# Patient Record
Sex: Male | Born: 1980 | Race: Black or African American | Hispanic: No | Marital: Married | Smoking: Never smoker
Health system: Southern US, Community
[De-identification: ages and names within clinical notes are randomized; demographics above are authoritative.]

## PROBLEM LIST (undated history)

## (undated) DIAGNOSIS — K4090 Unilateral inguinal hernia, without obstruction or gangrene, not specified as recurrent: Secondary | ICD-10-CM

---

## 2001-05-30 ENCOUNTER — Encounter: Payer: Self-pay | Admitting: Emergency Medicine

## 2001-05-30 ENCOUNTER — Emergency Department (HOSPITAL_COMMUNITY): Admission: EM | Admit: 2001-05-30 | Discharge: 2001-05-30 | Payer: Self-pay | Admitting: Emergency Medicine

## 2003-07-30 ENCOUNTER — Emergency Department (HOSPITAL_COMMUNITY): Admission: EM | Admit: 2003-07-30 | Discharge: 2003-07-30 | Payer: Self-pay | Admitting: *Deleted

## 2004-12-19 ENCOUNTER — Ambulatory Visit: Payer: Self-pay | Admitting: Internal Medicine

## 2011-10-20 ENCOUNTER — Emergency Department (HOSPITAL_COMMUNITY)
Admission: EM | Admit: 2011-10-20 | Discharge: 2011-10-21 | Disposition: A | Discharge status: Home / Self Care | Payer: Self-pay | Attending: Emergency Medicine | Admitting: Emergency Medicine

## 2011-10-20 ENCOUNTER — Emergency Department (HOSPITAL_COMMUNITY): Payer: Self-pay

## 2011-10-20 ENCOUNTER — Encounter (HOSPITAL_COMMUNITY): Payer: Self-pay | Admitting: *Deleted

## 2011-10-20 DIAGNOSIS — M7989 Other specified soft tissue disorders: Secondary | ICD-10-CM | POA: Insufficient documentation

## 2011-10-20 DIAGNOSIS — I10 Essential (primary) hypertension: Secondary | ICD-10-CM | POA: Insufficient documentation

## 2011-10-20 DIAGNOSIS — M79609 Pain in unspecified limb: Secondary | ICD-10-CM | POA: Insufficient documentation

## 2011-10-20 DIAGNOSIS — W208XXA Other cause of strike by thrown, projected or falling object, initial encounter: Secondary | ICD-10-CM | POA: Insufficient documentation

## 2011-10-20 DIAGNOSIS — M79673 Pain in unspecified foot: Secondary | ICD-10-CM

## 2011-10-20 NOTE — ED Notes (Signed)
Pt dropped tv on L foot. Unable to bear weight.

## 2011-10-20 NOTE — ED Notes (Signed)
Pt in stating he was moving a TV and it fell on his left foot, increased pain with walking

## 2011-10-21 MED ORDER — OXYCODONE-ACETAMINOPHEN 5-325 MG PO TABS: 1.0000 | ORAL_TABLET | Freq: Four times a day (QID) | ORAL | Status: AC | PRN

## 2011-10-21 MED ORDER — IBUPROFEN 800 MG PO TABS: 800.0000 mg | ORAL_TABLET | Freq: Three times a day (TID) | ORAL | Status: AC | PRN

## 2011-10-21 MED ORDER — OXYCODONE-ACETAMINOPHEN 5-325 MG PO TABS
1.0000 | ORAL_TABLET | Freq: Once | ORAL | Status: AC
  Administered 2011-10-21: 1 via ORAL
  Filled 2011-10-21: qty 1

## 2011-10-21 NOTE — Discharge Instructions (Signed)
Your x-rays during her hospital visit are negative for acute fracture, however due to your story and physical exam I am treating you as though you had a fracture.  Followup with orthopedics in 3-5 days.  I have listed a doctor in your discharge paperwork for you to call.  A repeat x-ray in 2-3 weeks is recommended.  Reconstructions below to learn more about the type of fracture or you may have.  Use your crutches to avoid pain and you may weight bear as tolerated.  Use pain medication as prescribed and to not operate any heavy machinery while on this pain medication.  Read instructions below on ice therapy.  Use Motrin as prescribed and take with a full meal to prevent upset stomach   Metatarsal Stress Fracture  When too much stress is put on the foot, as in running and jumping sports, the center shaft of the bones of the forefoot is very susceptible to stress fractures (break in bone). This is because of repetitive stress on the bone. This injury is more common if osteoporosis is present or if inadequate running shoes are used. Rapid increase in running distances are often the cause. Running distances should be gradually increased to avoid this problem. Shoes should be used which adequately cushion the foot. Shoes should absorb the shocks of the activity.  DIAGNOSIS  Usually the diagnosis is made by history. The foot progressively becomes sorer with activities. X-rays may be negative (show no break) within the first 2 to 3 weeks of the beginning of pain. A later X-ray may show signs of healing bone (callus formation). A bone scan or MRI will usually make the diagnosis earlier.  TREATMENT AND HOME CARE INSTRUCTIONS  Treatment may or may not include a cast, removable fracture boot, or walking shoe. Casts are used for short periods of time to prevent muscle atrophy (muscle wasting).  Activities should be stopped until further advised by your caregiver.  Wear shoes with adequate shock absorbing abilities.    Alternative exercise may be undertaken while waiting for healing. These may include bicycling and swimming, or as your caregiver suggests.  If you do not have a cast or splint:  You may walk on your injured foot as tolerated or advised.  Do not put any weight on your injured foot for as long as directed by your caregiver. Slowly increase the amount of time you walk on the foot as the pain allows or as advised.  Use crutches until you can bear weight without pain. A gradual increase in weight bearing may help.  Apply ice to the injury for 15 to 20 minutes each hour while awake for the first 2 days. Put the ice in a plastic bag and place a towel between the bag of ice and your skin.  Only take over-the-counter or prescription medicines for pain, discomfort, or fever as directed by your caregiver.  SEEK IMMEDIATE MEDICAL CARE IF:  Pain is becoming worse rather than better, or if pain is uncontrolled with medications.  You have increased swelling or redness in the foot.   Cryotherapy  Cryotherapy means treatment with cold. Ice or gel packs can be used to reduce both pain and swelling. Ice is the most helpful within the first 24 to 48 hours after an injury or flareup from overusing a muscle or joint. Sprains, strains, spasms, burning pain, shooting pain, and aches can all be eased with ice. Ice can also be used when recovering from surgery. Ice is effective, has very  few side effects, and is safe for most people to use.  PRECAUTIONS  Ice is not a safe treatment option for people with:  Raynaud's phenomenon. This is a condition affecting small blood vessels in the extremities. Exposure to cold may cause your problems to return.  Cold hypersensitivity. There are many forms of cold hypersensitivity, including:  Cold urticaria. Red, itchy hives appear on the skin when the tissues begin to warm after being iced.  Cold erythema. This is a red, itchy rash caused by exposure to cold.  Cold hemoglobinuria.  Red blood cells break down when the tissues begin to warm after being iced. The hemoglobin that carry oxygen are passed into the urine because they cannot combine with blood proteins fast enough.  Numbness or altered sensitivity in the area being iced.  If you have any of the following conditions, do not use ice until you have discussed cryotherapy with your caregiver:  Heart conditions, such as arrhythmia, angina, or chronic heart disease.  High blood pressure.  Healing wounds or open skin in the area being iced.  Current infections.  Rheumatoid arthritis.  Poor circulation.  Diabetes.  Ice slows the blood flow in the region it is applied. This is beneficial when trying to stop inflamed tissues from spreading irritating chemicals to surrounding tissues. However, if you expose your skin to cold temperatures for too long or without the proper protection, you can damage your skin or nerves. Watch for signs of skin damage due to cold.  HOME CARE INSTRUCTIONS  Follow these tips to use ice and cold packs safely.  Place a dry or damp towel between the ice and skin. A damp towel will cool the skin more quickly, so you may need to shorten the time that the ice is used.  For a more rapid response, add gentle compression to the ice.  Ice for no more than 10 to 20 minutes at a time. The bonier the area you are icing, the less time it will take to get the benefits of ice.  Check your skin after 5 minutes to make sure there are no signs of a poor response to cold or skin damage.  Rest 20 minutes or more in between uses.  Once your skin is numb, you can end your treatment. You can test numbness by very lightly touching your skin. The touch should be so light that you do not see the skin dimple from the pressure of your fingertip. When using ice, most people will feel these normal sensations in this order: cold, burning, aching, and numbness.  Do not use ice on someone who cannot communicate their responses to  pain, such as small children or people with dementia.  HOW TO MAKE AN ICE PACK  Ice packs are the most common way to use ice therapy. Other methods include ice massage, ice baths, and cryo-sprays. Muscle creams that cause a cold, tingly feeling do not offer the same benefits that ice offers and should not be used as a substitute unless recommended by your caregiver.  To make an ice pack, do one of the following:  Place crushed ice or a bag of frozen vegetables in a sealable plastic bag. Squeeze out the excess air. Place this bag inside another plastic bag. Slide the bag into a pillowcase or place a damp towel between your skin and the bag.  Mix 3 parts water with 1 part rubbing alcohol. Freeze the mixture in a sealable plastic bag. When you remove the mixture  from the freezer, it will be slushy. Squeeze out the excess air. Place this bag inside another plastic bag. Slide the bag into a pillowcase or place a damp towel between your skin and the bag.  SEEK MEDICAL CARE IF:  You develop white spots on your skin. This may give the skin a blotchy (mottled) appearance.  Your skin turns blue or pale.  Your skin becomes waxy or hard.  Your swelling gets worse.

## 2011-10-21 NOTE — ED Provider Notes (Signed)
History     CSN: [id number]  Arrival date & time 10/20/11  2202   First MD Initiated Contact with Patient 10/21/11 0010      Chief Complaint  Patient presents with  . Foot Injury    Dropped tv on L foot    (Consider location/radiation/quality/duration/timing/severity/associated sxs/prior treatment) HPI Comments: Patient presents emergency Department with a chief complaint of left foot pain.  Patient states he was moving a very large heavy old school box television when it fell on his left foot from about height is 4 feet that was dropped with impact being left foot. Pain severity is 10/10. Pt is unable to ambulate or bear weight d/t pain. Pt denies numbness and tingling of extremity, states he still has feeling, and denies any ankle pain. Pt says he is able to wiggle his toes, but that this causes extreme pain  The history is provided by the patient.    History reviewed. No pertinent past medical history.  Past Surgical History  Procedure Date  . Dental surgery     History reviewed. No pertinent family history.  History  Substance Use Topics  . Smoking status: Never Smoker   . Smokeless tobacco: Not on file  . Alcohol Use: No      Review of Systems  Constitutional: Negative for fever, chills and appetite change.  HENT: Negative for congestion.   Eyes: Negative for visual disturbance.  Respiratory: Negative for shortness of breath.   Cardiovascular: Negative for chest pain and leg swelling.  Gastrointestinal: Negative for abdominal pain.  Genitourinary: Negative for dysuria, urgency and frequency.  Musculoskeletal: Positive for gait problem.  Neurological: Negative for dizziness, syncope, weakness, light-headedness, numbness and headaches.  Psychiatric/Behavioral: Negative for confusion.    Allergies  Review of patient's allergies indicates no known allergies.  Home Medications   Current Outpatient Rx  Name Route Sig Dispense Refill  . BISMUTH SUBSALICYLATE  262 MG PO CHEW Oral Chew 524 mg by mouth as needed.      BP 140/92  Pulse 69  Temp(Src) 98.8 F (37.1 C) (Oral)  Resp 18  Ht 6\' 1"  (1.854 m)  Wt 180 lb (81.647 kg)  BMI 23.75 kg/m2  SpO2 100%  Physical Exam  Nursing note and vitals reviewed. Constitutional: He is oriented to person, place, and time. He appears well-developed and well-nourished. No distress.  HENT:  Head: Normocephalic and atraumatic.  Eyes: Conjunctivae and EOM are normal.  Neck: Normal range of motion.  Pulmonary/Chest: Effort normal.  Musculoskeletal: Normal range of motion.       Full active and passive range of motion of right ankle.  No tenderness to palpation over distal fibula or tibia no tenderness over medial or lateral malleolus.  Extreme pain with passive eversion and inversion of foot.  Tenderness to palpation over her second and third metatarsal.  Contusion and swelling over this area as well.  Distal pulses and sensation intact.  Normal capillary refill.  Skin intact  Neurological: He is alert and oriented to person, place, and time.  Skin: Skin is warm and dry. No rash noted. He is not diaphoretic.  Psychiatric: He has a normal mood and affect. His behavior is normal.    ED Course  Procedures (including critical care time)  Labs Reviewed - No data to display Dg Foot Complete Left  10/20/2011  *RADIOLOGY REPORT*  Clinical Data: TV fell on left foot.  Pain across metatarsal bones.  LEFT FOOT - COMPLETE 3+ VIEW  Comparison: None.  Findings: Three views of the left foot were obtained.  Negative for fracture or dislocation.  Normal alignment of the left foot.  IMPRESSION: Negative for acute fracture or dislocation.  Original Report Authenticated By: Richarda Overlie, M.D.     No diagnosis found.    MDM  Foot pain  Patient history and presentation is consistent with possible stress fracture of the second and third metatarsal.  Initial x-rays are negative.  Patient has been advised for repeat x-rays in  2-3 weeks.  Patient treated in the emergency department with a Cam Walker, given crutches, advised for rest and followup with orthopedics in 3-5 days.       Jaci Carrel, [place] 10/21/11 [phone]

## 2011-10-21 NOTE — ED Provider Notes (Signed)
Medical screening examination/treatment/procedure(s) were performed by non-physician practitioner and as supervising physician I was immediately available for consultation/collaboration.  Nicholes Stairs, MD 10/21/11 1205

## 2012-11-17 ENCOUNTER — Ambulatory Visit
Admission: RE | Admit: 2012-11-17 | Discharge: 2012-11-17 | Disposition: A | Discharge status: Home / Self Care | Payer: Self-pay | Source: Ambulatory Visit | Attending: Radiation Oncology | Admitting: Radiation Oncology

## 2012-11-17 ENCOUNTER — Encounter: Payer: Self-pay | Admitting: Radiation Oncology

## 2012-11-17 NOTE — Progress Notes (Deleted)
Patient here weekly rad tx leeft breast, dermatitis in between breasts, uses cortisone cream, and radiaplex gel, slight erythema rest of breast , skin intact, no c/o pain, eating well,no fatigue 8:52 AM

## 2013-03-03 DIAGNOSIS — K4090 Unilateral inguinal hernia, without obstruction or gangrene, not specified as recurrent: Secondary | ICD-10-CM

## 2013-03-03 HISTORY — DX: Unilateral inguinal hernia, without obstruction or gangrene, not specified as recurrent: K40.90

## 2013-03-09 ENCOUNTER — Ambulatory Visit (INDEPENDENT_AMBULATORY_CARE_PROVIDER_SITE_OTHER): Payer: Medicaid Other | Admitting: Surgery

## 2013-03-09 ENCOUNTER — Encounter (INDEPENDENT_AMBULATORY_CARE_PROVIDER_SITE_OTHER): Payer: Self-pay | Admitting: Surgery

## 2013-03-09 VITALS — BP 110/68 | HR 66 | Temp 97.5°F | Resp 15 | Ht 73.0 in | Wt 181.8 lb

## 2013-03-09 DIAGNOSIS — K4090 Unilateral inguinal hernia, without obstruction or gangrene, not specified as recurrent: Secondary | ICD-10-CM | POA: Insufficient documentation

## 2013-03-09 NOTE — Patient Instructions (Signed)
Inguinal Hernia, Adult  Care After Refer to this sheet in the next few weeks. These discharge instructions provide you with general information on caring for yourself after you leave the hospital. Your caregiver may also give you specific instructions. Your treatment has been planned according to the most current medical practices available, but unavoidable complications sometimes occur. If you have any problems or questions after discharge, please call your caregiver. HOME CARE INSTRUCTIONS  Put ice on the operative site.  Put ice in a plastic bag.  Place a towel between your skin and the bag.  Leave the ice on for 15-20 minutes at a time, 3-4 times a day while awake.  Change bandages (dressings) as directed.  Keep the wound dry and clean. The wound may be washed gently with soap and water. Gently blot or dab the wound dry. It is okay to take showers 24 to 48 hours after surgery. Do not take baths, use swimming pools, or use hot tubs for 10 days, or as directed by your caregiver.  Only take over-the-counter or prescription medicines for pain, discomfort, or fever as directed by your caregiver.  Continue your normal diet as directed.  Do not lift anything more than 10 pounds or play contact sports for 3 weeks, or as directed. SEEK MEDICAL CARE IF:  There is redness, swelling, or increasing pain in the wound.  There is fluid (pus) coming from the wound.  There is drainage from a wound lasting longer than 1 day.  You have an oral temperature above 102 F (38.9 C).  You notice a bad smell coming from the wound or dressing.  The wound breaks open after the stitches (sutures) have been removed.  You notice increasing pain in the shoulders (shoulder strap areas).  You develop dizzy episodes or fainting while standing.  You feel sick to your stomach (nauseous) or throw up (vomit). SEEK IMMEDIATE MEDICAL CARE IF:  You develop a rash.  You have difficulty breathing.  You  develop a reaction or have side effects to medicines you were given. MAKE SURE YOU:   Understand these instructions.  Will watch your condition.  Will get help right away if you are not doing well or get worse. Document Released: 09/20/2006 Document Revised: 11/12/2011 Document Reviewed: 07/20/2009 Exit[employer] Patient Information 2014 [employer].  

## 2013-03-09 NOTE — Progress Notes (Signed)
Patient ID: Ricky Keith, male   DOB: Jan 16, 1981, 32 y.o.   MRN: [id number]  Chief Complaint  Patient presents with  . New Evaluation    eval [facility]    HPI Ricky Keith is a 32 y.o. male.  Patient sent the request of Dr. Mayford Knife for bulge  in his left groin. HPIthis has been present for one year. Made worse with exertion. The bulge is larger. Is causing mild to moderate discomfort with exertion.  History reviewed. No pertinent past medical history.  Past Surgical History  Procedure Laterality Date  . Dental surgery      Family History  Problem Relation Age of Onset  . Hypertension Mother     Social History History  Substance Use Topics  . Smoking status: Never Smoker   . Smokeless tobacco: Not on file  . Alcohol Use: No    No Known Allergies  No current outpatient prescriptions on file.   No current facility-administered medications for this visit.    Review of Systems Review of Systems  Constitutional: Negative for fever, chills and unexpected weight change.  HENT: Negative for hearing loss, congestion, sore throat, trouble swallowing and voice change.   Eyes: Negative for visual disturbance.  Respiratory: Negative for cough and wheezing.   Cardiovascular: Negative for chest pain, palpitations and leg swelling.  Gastrointestinal: Negative for nausea, vomiting, abdominal pain, diarrhea, constipation, blood in stool, abdominal distention, anal bleeding and rectal pain.  Genitourinary: Negative for hematuria and difficulty urinating.  Musculoskeletal: Negative for arthralgias.  Skin: Negative for rash and wound.  Neurological: Negative for seizures, syncope, weakness and headaches.  Hematological: Negative for adenopathy. Does not bruise/bleed easily.  Psychiatric/Behavioral: Negative for confusion.    Blood pressure 110/68, pulse 66, temperature 97.5 F (36.4 C), temperature source Temporal, resp. rate 15, height 6\' 1"  (1.854 m), weight 181 lb 12.8 oz  (82.464 kg).  Physical Exam Physical Exam  Constitutional: He is oriented to person, place, and time. He appears well-developed. He appears distressed.  HENT:  Head: Normocephalic and atraumatic.  Eyes: Pupils are equal, round, and reactive to light.  Neck: Normal range of motion.  Cardiovascular: Normal rate and regular rhythm.   Pulmonary/Chest: Effort normal and breath sounds normal.  Abdominal: Soft. Bowel sounds are normal. There is no tenderness. A hernia is present. Hernia confirmed positive in the left inguinal area. Hernia confirmed negative in the right inguinal area.  Musculoskeletal: Normal range of motion.  Neurological: He is alert and oriented to person, place, and time.  Skin: Skin is warm and dry.  Psychiatric: He has a normal mood and affect. His behavior is normal. Judgment and thought content normal.    Data Reviewed none  Assessment    Left inguinal hernia reducible    Plan    Discussed options of observation versus repair. Recommend left inguinal hernia with mesh.The risk of hernia repair include bleeding,  Infection,   Recurrence of the hernia,  Mesh use, chronic pain,  Organ injury,  Bowel injury,  Bladder injury,   nerve injury with numbness around the incision,  Death,  and worsening of preexisting  medical problems.  The alternatives to surgery have been discussed as well.Discussed possible infertility. Long term expectations of both operative and non operative treatments have been discussed.   The patient agrees to proceed.         Monasia Lair A. 03/09/2013, 12:08 PM

## 2013-03-12 ENCOUNTER — Encounter (HOSPITAL_BASED_OUTPATIENT_CLINIC_OR_DEPARTMENT_OTHER): Payer: Self-pay | Admitting: *Deleted

## 2013-03-17 ENCOUNTER — Encounter (HOSPITAL_BASED_OUTPATIENT_CLINIC_OR_DEPARTMENT_OTHER): Payer: Self-pay | Admitting: *Deleted

## 2013-03-17 ENCOUNTER — Ambulatory Visit (HOSPITAL_BASED_OUTPATIENT_CLINIC_OR_DEPARTMENT_OTHER): Payer: Medicaid Other | Admitting: Anesthesiology

## 2013-03-17 ENCOUNTER — Ambulatory Visit (HOSPITAL_BASED_OUTPATIENT_CLINIC_OR_DEPARTMENT_OTHER)
Admission: RE | Admit: 2013-03-17 | Discharge: 2013-03-17 | Disposition: A | Discharge status: Home / Self Care | Payer: Medicaid Other | Source: Ambulatory Visit | Attending: Surgery | Admitting: Surgery

## 2013-03-17 ENCOUNTER — Encounter (HOSPITAL_BASED_OUTPATIENT_CLINIC_OR_DEPARTMENT_OTHER): Payer: Self-pay | Admitting: Anesthesiology

## 2013-03-17 ENCOUNTER — Encounter (HOSPITAL_BASED_OUTPATIENT_CLINIC_OR_DEPARTMENT_OTHER)
Admission: RE | Disposition: A | Discharge status: Home / Self Care | Payer: Self-pay | Source: Ambulatory Visit | Attending: Surgery

## 2013-03-17 DIAGNOSIS — K4090 Unilateral inguinal hernia, without obstruction or gangrene, not specified as recurrent: Secondary | ICD-10-CM

## 2013-03-17 HISTORY — PX: INSERTION OF MESH: SHX5868

## 2013-03-17 HISTORY — PX: INGUINAL HERNIA REPAIR: SHX194

## 2013-03-17 HISTORY — DX: Unilateral inguinal hernia, without obstruction or gangrene, not specified as recurrent: K40.90

## 2013-03-17 SURGERY — REPAIR, HERNIA, INGUINAL, ADULT
Anesthesia: General | Site: Groin | Laterality: Left | Wound class: Clean

## 2013-03-17 MED ORDER — CHLORHEXIDINE GLUCONATE 4 % EX LIQD: 1.0000 "application " | Freq: Once | CUTANEOUS | Status: DC

## 2013-03-17 MED ORDER — GLYCOPYRROLATE 0.2 MG/ML IJ SOLN
INTRAMUSCULAR | Status: DC | PRN
  Administered 2013-03-17: 0.4 mg via INTRAVENOUS

## 2013-03-17 MED ORDER — ONDANSETRON HCL 4 MG/2ML IJ SOLN
INTRAMUSCULAR | Status: DC | PRN
  Administered 2013-03-17: 4 mg via INTRAVENOUS

## 2013-03-17 MED ORDER — DEXTROSE 5 % IV SOLN
3.0000 g | INTRAVENOUS | Status: AC
  Administered 2013-03-17: 3 g via INTRAVENOUS

## 2013-03-17 MED ORDER — LIDOCAINE HCL (CARDIAC) 20 MG/ML IV SOLN
INTRAVENOUS | Status: DC | PRN
  Administered 2013-03-17: 60 mg via INTRAVENOUS

## 2013-03-17 MED ORDER — LIDOCAINE HCL 4 % MT SOLN
OROMUCOSAL | Status: DC | PRN
  Administered 2013-03-17: 2 mL via TOPICAL

## 2013-03-17 MED ORDER — LACTATED RINGERS IV SOLN
INTRAVENOUS | Status: DC
  Administered 2013-03-17 (×2): via INTRAVENOUS
  Administered 2013-03-17: 20 mL/h via INTRAVENOUS

## 2013-03-17 MED ORDER — HYDROMORPHONE HCL PF 1 MG/ML IJ SOLN
0.2500 mg | INTRAMUSCULAR | Status: DC | PRN
  Administered 2013-03-17 (×2): 0.5 mg via INTRAVENOUS

## 2013-03-17 MED ORDER — DEXAMETHASONE SODIUM PHOSPHATE 4 MG/ML IJ SOLN
INTRAMUSCULAR | Status: DC | PRN
  Administered 2013-03-17: 10 mg via INTRAVENOUS

## 2013-03-17 MED ORDER — OXYCODONE HCL 5 MG/5ML PO SOLN: 5.0000 mg | Freq: Once | ORAL | Status: DC | PRN

## 2013-03-17 MED ORDER — ROCURONIUM BROMIDE 100 MG/10ML IV SOLN
INTRAVENOUS | Status: DC | PRN
  Administered 2013-03-17: 40 mg via INTRAVENOUS

## 2013-03-17 MED ORDER — FENTANYL CITRATE 0.05 MG/ML IJ SOLN
50.0000 ug | INTRAMUSCULAR | Status: DC | PRN
  Administered 2013-03-17: 100 ug via INTRAVENOUS

## 2013-03-17 MED ORDER — FENTANYL CITRATE 0.05 MG/ML IJ SOLN
INTRAMUSCULAR | Status: DC | PRN
  Administered 2013-03-17: 100 ug via INTRAVENOUS

## 2013-03-17 MED ORDER — MIDAZOLAM HCL 2 MG/2ML IJ SOLN
1.0000 mg | INTRAMUSCULAR | Status: DC | PRN
  Administered 2013-03-17: 2 mg via INTRAVENOUS

## 2013-03-17 MED ORDER — TRAMADOL HCL 50 MG PO TABS: 50.0000 mg | ORAL_TABLET | Freq: Four times a day (QID) | ORAL | Status: DC | PRN

## 2013-03-17 MED ORDER — BUPIVACAINE-EPINEPHRINE 0.25% -1:200000 IJ SOLN
INTRAMUSCULAR | Status: DC | PRN
Start: 2013-03-17 — End: 2013-03-17
  Administered 2013-03-17: 8 mL

## 2013-03-17 MED ORDER — PROPOFOL 10 MG/ML IV BOLUS
INTRAVENOUS | Status: DC | PRN
  Administered 2013-03-17: 200 mg via INTRAVENOUS

## 2013-03-17 MED ORDER — MIDAZOLAM HCL 2 MG/ML PO SYRP: 12.0000 mg | ORAL_SOLUTION | Freq: Once | ORAL | Status: DC | PRN

## 2013-03-17 MED ORDER — OXYCODONE HCL 5 MG PO TABS: 5.0000 mg | ORAL_TABLET | Freq: Once | ORAL | Status: DC | PRN

## 2013-03-17 MED ORDER — PROMETHAZINE HCL 25 MG/ML IJ SOLN: 6.2500 mg | INTRAMUSCULAR | Status: DC | PRN

## 2013-03-17 MED ORDER — NEOSTIGMINE METHYLSULFATE 1 MG/ML IJ SOLN
INTRAMUSCULAR | Status: DC | PRN
  Administered 2013-03-17: 3 mg via INTRAVENOUS

## 2013-03-17 MED ORDER — BUPIVACAINE-EPINEPHRINE PF 0.5-1:200000 % IJ SOLN
INTRAMUSCULAR | Status: DC | PRN
  Administered 2013-03-17: 150 mg

## 2013-03-17 MED ORDER — OXYCODONE-ACETAMINOPHEN 5-325 MG PO TABS: 2.0000 | ORAL_TABLET | ORAL | Status: DC | PRN

## 2013-03-17 SURGICAL SUPPLY — 49 items
BLADE SURG 15 STRL LF DISP TIS (BLADE) ×1 IMPLANT
BLADE SURG 15 STRL SS (BLADE) ×1
BLADE SURG ROTATE 9660 (MISCELLANEOUS) ×2 IMPLANT
CANISTER SUCTION 1200CC (MISCELLANEOUS) ×2 IMPLANT
CHLORAPREP W/TINT 26ML (MISCELLANEOUS) ×2 IMPLANT
CLOTH BEACON ORANGE TIMEOUT ST (SAFETY) ×2 IMPLANT
COVER MAYO STAND STRL (DRAPES) ×2 IMPLANT
COVER TABLE BACK 60X90 (DRAPES) ×2 IMPLANT
DECANTER SPIKE VIAL GLASS SM (MISCELLANEOUS) IMPLANT
DERMABOND ADVANCED (GAUZE/BANDAGES/DRESSINGS) ×1
DERMABOND ADVANCED .7 DNX12 (GAUZE/BANDAGES/DRESSINGS) ×1 IMPLANT
DRAIN PENROSE 1/2X12 LTX STRL (WOUND CARE) ×2 IMPLANT
DRAPE LAPAROTOMY TRNSV 102X78 (DRAPE) ×2 IMPLANT
DRAPE UTILITY XL STRL (DRAPES) ×2 IMPLANT
ELECT COATED BLADE 2.86 ST (ELECTRODE) ×2 IMPLANT
ELECT REM PT RETURN 9FT ADLT (ELECTROSURGICAL) ×2
ELECTRODE REM PT RTRN 9FT ADLT (ELECTROSURGICAL) ×1 IMPLANT
GAUZE SPONGE 4X4 12PLY STRL LF (GAUZE/BANDAGES/DRESSINGS) IMPLANT
GAUZE SPONGE 4X4 16PLY XRAY LF (GAUZE/BANDAGES/DRESSINGS) IMPLANT
GLOVE BIO SURGEON STRL SZ8 (GLOVE) ×2 IMPLANT
GLOVE BIOGEL PI IND STRL 8 (GLOVE) ×2 IMPLANT
GLOVE BIOGEL PI INDICATOR 8 (GLOVE) ×2
GLOVE ECLIPSE 8.0 STRL XLNG CF (GLOVE) ×2 IMPLANT
GOWN PREVENTION PLUS XLARGE (GOWN DISPOSABLE) ×2 IMPLANT
GOWN PREVENTION PLUS XXLARGE (GOWN DISPOSABLE) ×2 IMPLANT
MESH HERNIA SYS ULTRAPRO LRG (Mesh General) ×2 IMPLANT
NEEDLE HYPO 25X1 1.5 SAFETY (NEEDLE) ×2 IMPLANT
NS IRRIG 1000ML POUR BTL (IV SOLUTION) ×2 IMPLANT
PACK BASIN DAY SURGERY FS (CUSTOM PROCEDURE TRAY) ×2 IMPLANT
PENCIL BUTTON HOLSTER BLD 10FT (ELECTRODE) ×2 IMPLANT
SLEEVE SCD COMPRESS KNEE MED (MISCELLANEOUS) ×2 IMPLANT
SPONGE LAP 4X18 X RAY DECT (DISPOSABLE) ×2 IMPLANT
STAPLER VISISTAT 35W (STAPLE) IMPLANT
SUT MON AB 4-0 PC3 18 (SUTURE) ×2 IMPLANT
SUT NOVA 0 T19/GS 22DT (SUTURE) ×4 IMPLANT
SUT VIC AB 0 SH 27 (SUTURE) ×2 IMPLANT
SUT VIC AB 2-0 SH 27 (SUTURE) ×1
SUT VIC AB 2-0 SH 27XBRD (SUTURE) ×1 IMPLANT
SUT VIC AB 3-0 54X BRD REEL (SUTURE) IMPLANT
SUT VIC AB 3-0 BRD 54 (SUTURE)
SUT VICRYL 3-0 CR8 SH (SUTURE) ×2 IMPLANT
SUT VICRYL AB 2 0 TIE (SUTURE) ×1 IMPLANT
SUT VICRYL AB 2 0 TIES (SUTURE) ×1
SYR CONTROL 10ML LL (SYRINGE) ×2 IMPLANT
TAPE HYPAFIX 4 X10 (GAUZE/BANDAGES/DRESSINGS) IMPLANT
TOWEL OR 17X24 6PK STRL BLUE (TOWEL DISPOSABLE) ×2 IMPLANT
TOWEL OR NON WOVEN STRL DISP B (DISPOSABLE) ×2 IMPLANT
TUBE CONNECTING 20X1/4 (TUBING) ×2 IMPLANT
YANKAUER SUCT BULB TIP NO VENT (SUCTIONS) ×2 IMPLANT

## 2013-03-17 NOTE — Progress Notes (Signed)
Assisted Dr. Singer with left, ultrasound guided, transabdominal plane block. Side rails up, monitors on throughout procedure. See vital signs in flow sheet. Tolerated Procedure well.  

## 2013-03-17 NOTE — Interval H&P Note (Signed)
History and Physical Interval Note:  03/17/2013 1:06 PM  Ricky Keith  has presented today for surgery, with the diagnosis of inguinal hernia  The various methods of treatment have been discussed with the patient and family. After consideration of risks, benefits and other options for treatment, the patient has consented to  Procedure(s): LEFT INGUINAL HERNIA REPAIR (Left) INSERTION OF MESH (Left) as a surgical intervention .  The patient's history has been reviewed, patient examined, no change in status, stable for surgery.  I have reviewed the patient's chart and labs.  Questions were answered to the patient's satisfaction.     Ricky Zelaya A.

## 2013-03-17 NOTE — Discharge Instructions (Signed)
[facility] _______[facility]  UMBILICAL OR INGUINAL HERNIA REPAIR: POST OP INSTRUCTIONS  Always review your discharge instruction sheet given to you by the facility where your surgery was performed. IF YOU HAVE DISABILITY OR FAMILY LEAVE FORMS, YOU MUST BRING THEM TO THE OFFICE FOR PROCESSING.   DO NOT GIVE THEM TO YOUR DOCTOR.  1. A  prescription for pain medication may be given to you upon discharge.  Take your pain medication as prescribed, if needed.  If narcotic pain medicine is not needed, then you may take acetaminophen (Tylenol) or ibuprofen (Advil) as needed. 2. Take your usually prescribed medications unless otherwise directed. 3. If you need a refill on your pain medication, please contact your pharmacy.  They will contact our office to request authorization. Prescriptions will not be filled after 5 pm or on week-ends. 4. You should follow a light diet the first 24 hours after arrival home, such as soup and crackers, etc.  Be sure to include lots of fluids daily.  Resume your normal diet the day after surgery. 5. Most patients will experience some swelling and bruising around the umbilicus or in the groin and scrotum.  Ice packs and reclining will help.  Swelling and bruising can take several days to resolve.  6. It is common to experience some constipation if taking pain medication after surgery.  Increasing fluid intake and taking a stool softener (such as Colace) will usually help or prevent this problem from occurring.  A mild laxative (Milk of Magnesia or Miralax) should be taken according to package directions if there are no bowel movements after 48 hours. 7. Unless discharge instructions indicate otherwise, you may remove your bandages 24-48 hours after surgery, and you may shower at that time.  You may have steri-strips (small skin tapes) in place directly over the incision.  These strips should be left on the skin for 7-10 days.  If your surgeon used skin glue on the  incision, you may shower in 24 hours.  The glue will flake off over the next 2-3 weeks.  Any sutures or staples will be removed at the office during your follow-up visit. 8. ACTIVITIES:  You may resume regular (light) daily activities beginning the next day--such as daily self-care, walking, climbing stairs--gradually increasing activities as tolerated.  You may have sexual intercourse when it is comfortable.  Refrain from any heavy lifting or straining until approved by your doctor. a. You may drive when you are no longer taking prescription pain medication, you can comfortably wear a seatbelt, and you can safely maneuver your car and apply brakes. b. RETURN TO WORK:  ____3 - 4 weeks______________________________________________________ 9. You should see your doctor in the office for a follow-up appointment approximately 2-3 weeks after your surgery.  Make sure that you call for this appointment within a day or two after you arrive home to insure a convenient appointment time. 10. OTHER INSTRUCTIONS:  __________________________________________________________________________________________________________________________________________________________________________________________  WHEN TO CALL YOUR DOCTOR: 1. Fever over 101.0 2. Inability to urinate 3. Nausea and/or vomiting 4. Extreme swelling or bruising 5. Continued bleeding from incision. 6. Increased pain, redness, or drainage from the incision  The clinic staff is available to answer your questions during regular business hours.  Please dont hesitate to call and ask to speak to one of the nurses for clinical concerns.  If you have a medical emergency, go to the nearest emergency room or call 911.  A surgeon from [facility] is always on call at the  hospital   [address], [address], [place], [place]  [address] ?  [address] [address], [place], [place]   [address] [phone] ? [phone] ? FAX [phone] Web site:  [email or web address]   Post Anesthesia Home Care Instructions  Activity: Get plenty of rest for the remainder of the day. A responsible adult should stay with you for 24 hours following the procedure.  For the next 24 hours, DO NOT: -Drive a car -Advertising copywriter -Drink alcoholic beverages -Take any medication unless instructed by your physician -Make any legal decisions or sign important papers.  Meals: Start with liquid foods such as gelatin or soup. Progress to regular foods as tolerated. Avoid greasy, spicy, heavy foods. If nausea and/or vomiting occur, drink only clear liquids until the nausea and/or vomiting subsides. Call your physician if vomiting continues.  Special Instructions/Symptoms: Your throat may feel dry or sore from the anesthesia or the breathing tube placed in your throat during surgery. If this causes discomfort, gargle with warm salt water. The discomfort should disappear within 24 hours.

## 2013-03-17 NOTE — Anesthesia Preprocedure Evaluation (Signed)
Anesthesia Evaluation  Patient identified by MRN, date of birth, ID band Patient awake    Reviewed: Allergy & Precautions, H&P , NPO status , Patient's Chart, lab work & pertinent test results  Airway Mallampati: I TM Distance: >3 FB Neck ROM: full    Dental  (+) Teeth Intact and Dental Advidsory Given   Pulmonary neg pulmonary ROS,  breath sounds clear to auscultation        Cardiovascular negative cardio ROS  Rhythm:regular Rate:Normal     Neuro/Psych negative neurological ROS  negative psych ROS   GI/Hepatic negative GI ROS, Neg liver ROS,   Endo/Other  negative endocrine ROS  Renal/GU negative Renal ROS     Musculoskeletal   Abdominal   Peds  Hematology   Anesthesia Other Findings   Reproductive/Obstetrics negative OB ROS                           Anesthesia Physical Anesthesia Plan  ASA: I  Anesthesia Plan: General LMA and General ETT   Post-op Pain Management: MAC Combined w/ Regional for Post-op pain   Induction:   Airway Management Planned:   Additional Equipment:   Intra-op Plan:   Post-operative Plan:   Informed Consent: I have reviewed the patients History and Physical, chart, labs and discussed the procedure including the risks, benefits and alternatives for the proposed anesthesia with the patient or authorized representative who has indicated his/her understanding and acceptance.   Dental Advisory Given  Plan Discussed with: Anesthesiologist, CRNA and Surgeon  Anesthesia Plan Comments:         Anesthesia Quick Evaluation

## 2013-03-17 NOTE — Anesthesia Procedure Notes (Addendum)
Anesthesia Regional Block:  TAP block  Pre-Anesthetic Checklist: ,, timeout performed, Correct Patient, Correct Site, Correct Laterality, Correct Procedure, Correct Position, site marked, Risks and benefits discussed,  Surgical consent,  Pre-op evaluation,  At surgeon's request and post-op pain management  Laterality: Left  Prep: chloraprep       Needles:  Injection technique: Single-shot  Needle Type: Stimulator Needle - 80          Additional Needles:  Procedures: ultrasound guided (picture in chart) TAP block Narrative:  Start time: 03/17/2013 12:35 PM End time: 03/17/2013 12:45 PM  Performed by: Personally   TAP block Procedure Name: Intubation Date/Time: 03/17/2013 1:29 PM Performed by: Burna Cash Pre-anesthesia Checklist: Patient identified, Emergency Drugs available, Suction available and Patient being monitored Patient Re-evaluated:Patient Re-evaluated prior to inductionOxygen Delivery Method: Circle System Utilized Preoxygenation: Pre-oxygenation with 100% oxygen Intubation Type: IV induction Ventilation: Mask ventilation without difficulty Laryngoscope Size: Mac and 3 Grade View: Grade I Tube type: Oral Tube size: 8.0 mm Number of attempts: 1 Airway Equipment and Method: stylet and oral airway Placement Confirmation: ETT inserted through vocal cords under direct vision,  positive ETCO2 and breath sounds checked- equal and bilateral Secured at: 23 cm Tube secured with: Tape Dental Injury: Teeth and Oropharynx as per pre-operative assessment

## 2013-03-17 NOTE — H&P (View-Only) (Signed)
Patient ID: Ricky Keith, male   DOB: 02/06/1981, 32 y.o.   MRN: [id number]  Chief Complaint  Patient presents with  . New Evaluation    eval [facility]    HPI Ricky Keith is a 32 y.o. male.  Patient sent the request of Dr. Williams for bulge  in his left groin. HPIthis has been present for one year. Made worse with exertion. The bulge is larger. Is causing mild to moderate discomfort with exertion.  History reviewed. No pertinent past medical history.  Past Surgical History  Procedure Laterality Date  . Dental surgery      Family History  Problem Relation Age of Onset  . Hypertension Mother     Social History History  Substance Use Topics  . Smoking status: Never Smoker   . Smokeless tobacco: Not on file  . Alcohol Use: No    No Known Allergies  No current outpatient prescriptions on file.   No current facility-administered medications for this visit.    Review of Systems Review of Systems  Constitutional: Negative for fever, chills and unexpected weight change.  HENT: Negative for hearing loss, congestion, sore throat, trouble swallowing and voice change.   Eyes: Negative for visual disturbance.  Respiratory: Negative for cough and wheezing.   Cardiovascular: Negative for chest pain, palpitations and leg swelling.  Gastrointestinal: Negative for nausea, vomiting, abdominal pain, diarrhea, constipation, blood in stool, abdominal distention, anal bleeding and rectal pain.  Genitourinary: Negative for hematuria and difficulty urinating.  Musculoskeletal: Negative for arthralgias.  Skin: Negative for rash and wound.  Neurological: Negative for seizures, syncope, weakness and headaches.  Hematological: Negative for adenopathy. Does not bruise/bleed easily.  Psychiatric/Behavioral: Negative for confusion.    Blood pressure 110/68, pulse 66, temperature 97.5 F (36.4 C), temperature source Temporal, resp. rate 15, height 6' 1" (1.854 m), weight 181 lb 12.8 oz  (82.464 kg).  Physical Exam Physical Exam  Constitutional: He is oriented to person, place, and time. He appears well-developed. He appears distressed.  HENT:  Head: Normocephalic and atraumatic.  Eyes: Pupils are equal, round, and reactive to light.  Neck: Normal range of motion.  Cardiovascular: Normal rate and regular rhythm.   Pulmonary/Chest: Effort normal and breath sounds normal.  Abdominal: Soft. Bowel sounds are normal. There is no tenderness. A hernia is present. Hernia confirmed positive in the left inguinal area. Hernia confirmed negative in the right inguinal area.  Musculoskeletal: Normal range of motion.  Neurological: He is alert and oriented to person, place, and time.  Skin: Skin is warm and dry.  Psychiatric: He has a normal mood and affect. His behavior is normal. Judgment and thought content normal.    Data Reviewed none  Assessment    Left inguinal hernia reducible    Plan    Discussed options of observation versus repair. Recommend left inguinal hernia with mesh.The risk of hernia repair include bleeding,  Infection,   Recurrence of the hernia,  Mesh use, chronic pain,  Organ injury,  Bowel injury,  Bladder injury,   nerve injury with numbness around the incision,  Death,  and worsening of preexisting  medical problems.  The alternatives to surgery have been discussed as well.Discussed possible infertility. Long term expectations of both operative and non operative treatments have been discussed.   The patient agrees to proceed.         Clemmie Marxen A. 03/09/2013, 12:08 PM    

## 2013-03-17 NOTE — Anesthesia Postprocedure Evaluation (Signed)
Anesthesia Post Note  Patient: Ricky Keith  Procedure(s) Performed: Procedure(s) (LRB): LEFT INGUINAL HERNIA REPAIR (Left) INSERTION OF MESH (Left)  Anesthesia type: general  Patient location: PACU  Post pain: Pain level controlled  Post assessment: Patient's Cardiovascular Status Stable  Last Vitals:  Filed Vitals:   03/17/13 1534  BP: 140/87  Pulse: 57  Temp:   Resp: 15    Post vital signs: Reviewed and stable  Level of consciousness: sedated  Complications: No apparent anesthesia complications

## 2013-03-17 NOTE — Op Note (Signed)
Preoperative diagnosis: Left inguinal hernia  Postoperative diagnosis: Same  Procedure: Repair of left inguinal hernia with ultra pro hernia system  Surgeon: Harriette Bouillon M.D.  Anesthesia: Gen. With 0.25% Sensorcaine local with epinephrine and TAP block per anesthesia  EBL: Less than 20 cc  Specimen: None  IV fluids: Approximately 600 cc crystalloid  Drains: None  Indications for procedure: The patient presents for repair of symptomatic left inguinal hernia. It is causing pain discomfort. Risks, benefits and alternative therapies discussed with the patient.The risk of hernia repair include bleeding,  Infection,   Recurrence of the hernia,  Mesh use, chronic pain,  Organ injury,  Bowel injury,  Bladder injury,   nerve injury with numbness around the incision,  Death,  and worsening of preexisting  medical problems.  The alternatives to surgery have been discussed as well..  Long term expectations of both operative and non operative treatments have been discussed.   The patient agrees to proceed.  Description of procedure: The patient was met in the holding area and left groin was marked. Questions were answered. Block administered by anesthesia. He is taken back to the operating room and placed upon the operating room table. After induction of general anesthesia, both groins were prepped and draped in a sterile fashion. Timeout was done. I placed 8 cc of quarter percent Sensorcaine with epi and the skin. Left groin incision was made. Superficial epigastric vessels ligated with 3-0 Vicryl ties. The aponeurosis of the external oblique was identified. He was opened in the direction of the fibers through the external ring. Cord structures were encircled with quarter-inch Penrose drain. Ilioinguinal nerve identified direct defect noted. The floor of the inguinal canal was opened and the preperitoneal space was entered. Ulcer pro hernia system was placed with the inner leaflet in the preperitoneal  space and the onlay floor of the inguinal canal. Slit was cut cord structures. It was secured to the pubis with 0 Vicryl and a single stitch of 0 Vicryl was used to secure the connection between the onlay inlay to the internal oblique medial. The onlay was secured to the shelving edge of the inguinal ligament and to the internal oblique and conjoined tendon with interrupted 20 Novafil. The ilioinguinal nerve identified and was tethered onto her of mesh therefore divided as it exited the muscle. A slit was closed around the cord structures with 2-0 Novafil. There is ample room the cord to exit the mesh. Hemostasis was achieved. Aponeurosis of the external oblique was closed with 2-0 Vicryl 3-0 Vicryl was used to close Scarpa's fascia. 4 Monocryl used to close the skin. Dermabond applied. All final counts are found to be correct. The patient was awoke taken to recovery in satisfactory condition.

## 2013-03-17 NOTE — Transfer of Care (Signed)
Immediate Anesthesia Transfer of Care Note  Patient: Ricky Keith  Procedure(s) Performed: Procedure(s): LEFT INGUINAL HERNIA REPAIR (Left) INSERTION OF MESH (Left)  Patient Location: PACU  Anesthesia Type:GA combined with regional for post-op pain  Level of Consciousness: awake, alert  and oriented  Airway & Oxygen Therapy: Patient Spontanous Breathing and Patient connected to face mask oxygen  Post-op Assessment: Report given to PACU RN and Post -op Vital signs reviewed and stable  Post vital signs: Reviewed and stable  Complications: No apparent anesthesia complications

## 2013-03-18 ENCOUNTER — Encounter (HOSPITAL_BASED_OUTPATIENT_CLINIC_OR_DEPARTMENT_OTHER): Payer: Self-pay | Admitting: Surgery

## 2013-04-03 ENCOUNTER — Ambulatory Visit (INDEPENDENT_AMBULATORY_CARE_PROVIDER_SITE_OTHER): Payer: Medicaid Other | Admitting: Surgery

## 2013-04-03 ENCOUNTER — Encounter (INDEPENDENT_AMBULATORY_CARE_PROVIDER_SITE_OTHER): Payer: Self-pay | Admitting: Surgery

## 2013-04-03 VITALS — BP 118/80 | HR 76 | Temp 98.2°F | Resp 16 | Ht 73.0 in | Wt 183.0 lb

## 2013-04-03 DIAGNOSIS — Z9889 Other specified postprocedural states: Secondary | ICD-10-CM

## 2013-04-03 NOTE — Progress Notes (Signed)
Pt returns today after  Left inguinal hernia repair.  Pain is well controlled.  Bowels are functioning.  Wound is clean.  On exam:  Incision is clean /dry/intact.  Area is soft without signs of hernia recurrence.  Impression:  Status repair of hernia  Plan:  RTC PRN  Return to work now a full activity. No restrictions. Begin lifting slowly and advance as tolerated. If pain encountered back off for one week and advance as tolerated. Return as needed

## 2013-04-03 NOTE — Patient Instructions (Signed)
Increase activity as tolerated.  Return as needed.

## 2014-04-02 ENCOUNTER — Emergency Department (INDEPENDENT_AMBULATORY_CARE_PROVIDER_SITE_OTHER)
Admission: EM | Admit: 2014-04-02 | Discharge: 2014-04-02 | Disposition: A | Discharge status: Home / Self Care | Payer: Self-pay | Source: Home / Self Care

## 2014-04-02 ENCOUNTER — Encounter (HOSPITAL_COMMUNITY): Payer: Self-pay | Admitting: Emergency Medicine

## 2014-04-02 DIAGNOSIS — R0789 Other chest pain: Secondary | ICD-10-CM

## 2014-04-02 DIAGNOSIS — M542 Cervicalgia: Secondary | ICD-10-CM

## 2014-04-02 DIAGNOSIS — S43499A Other sprain of unspecified shoulder joint, initial encounter: Secondary | ICD-10-CM

## 2014-04-02 DIAGNOSIS — R071 Chest pain on breathing: Secondary | ICD-10-CM

## 2014-04-02 DIAGNOSIS — S46819A Strain of other muscles, fascia and tendons at shoulder and upper arm level, unspecified arm, initial encounter: Secondary | ICD-10-CM

## 2014-04-02 DIAGNOSIS — S46811A Strain of other muscles, fascia and tendons at shoulder and upper arm level, right arm, initial encounter: Secondary | ICD-10-CM

## 2014-04-02 NOTE — ED Provider Notes (Signed)
Medical screening examination/treatment/procedure(s) were performed by resident physician or non-physician practitioner and as supervising physician I was immediately available for consultation/collaboration.   KINDL,JAMES DOUGLAS MD.   James D Kindl, MD 04/02/14 1620 
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
## 2014-04-11 ENCOUNTER — Encounter (HOSPITAL_COMMUNITY): Payer: Self-pay | Admitting: Emergency Medicine

## 2014-04-11 ENCOUNTER — Emergency Department (HOSPITAL_COMMUNITY): Payer: No Typology Code available for payment source

## 2014-04-11 ENCOUNTER — Emergency Department (HOSPITAL_COMMUNITY)
Admission: EM | Admit: 2014-04-11 | Discharge: 2014-04-11 | Disposition: A | Discharge status: Home / Self Care | Payer: No Typology Code available for payment source | Attending: Emergency Medicine | Admitting: Emergency Medicine

## 2014-04-11 DIAGNOSIS — Y9389 Activity, other specified: Secondary | ICD-10-CM | POA: Insufficient documentation

## 2014-04-11 DIAGNOSIS — M25511 Pain in right shoulder: Secondary | ICD-10-CM

## 2014-04-11 DIAGNOSIS — S46909A Unspecified injury of unspecified muscle, fascia and tendon at shoulder and upper arm level, unspecified arm, initial encounter: Secondary | ICD-10-CM | POA: Diagnosis present

## 2014-04-11 DIAGNOSIS — M5412 Radiculopathy, cervical region: Secondary | ICD-10-CM

## 2014-04-11 DIAGNOSIS — S4980XA Other specified injuries of shoulder and upper arm, unspecified arm, initial encounter: Secondary | ICD-10-CM | POA: Insufficient documentation

## 2014-04-11 DIAGNOSIS — Y92410 Unspecified street and highway as the place of occurrence of the external cause: Secondary | ICD-10-CM | POA: Diagnosis not present

## 2014-04-11 MED ORDER — HYDROCODONE-ACETAMINOPHEN 5-325 MG PO TABS: 2.0000 | ORAL_TABLET | Freq: Four times a day (QID) | ORAL | Status: DC | PRN

## 2014-04-11 MED ORDER — PREDNISONE 20 MG PO TABS: 40.0000 mg | ORAL_TABLET | Freq: Every day | ORAL | Status: DC
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
## 2014-04-11 NOTE — Discharge Instructions (Signed)
Shoulder Pain °The shoulder is the joint that connects your arms to your body. The bones that form the shoulder joint include the upper arm bone (humerus), the shoulder blade (scapula), and the collarbone (clavicle). The top of the humerus is shaped like a ball and fits into a rather flat socket on the scapula (glenoid cavity). A combination of muscles and strong, fibrous tissues that connect muscles to bones (tendons) support your shoulder joint and hold the ball in the socket. Small, fluid-filled sacs (bursae) are located in different areas of the joint. They act as cushions between the bones and the overlying soft tissues and help reduce friction between the gliding tendons and the bone as you move your arm. Your shoulder joint allows a wide range of motion in your arm. This range of motion allows you to do things like scratch your back or throw a ball. However, this range of motion also makes your shoulder more prone to pain from overuse and injury. °Causes of shoulder pain can originate from both injury and overuse and usually can be grouped in the following four categories: °· Redness, swelling, and pain (inflammation) of the tendon (tendinitis) or the bursae (bursitis). °· Instability, such as a dislocation of the joint. °· Inflammation of the joint (arthritis). °· Broken bone (fracture). °HOME CARE INSTRUCTIONS  °· Apply ice to the sore area. °· Put ice in a plastic bag. °· Place a towel between your skin and the bag. °· Leave the ice on for 15-20 minutes, 3-4 times per day for the first 2 days, or as directed by your health care provider. °· Stop using cold packs if they do not help with the pain. °· If you have a shoulder sling or immobilizer, wear it as long as your caregiver instructs. Only remove it to shower or bathe. Move your arm as little as possible, but keep your hand moving to prevent swelling. °· Squeeze a soft ball or foam pad as much as possible to help prevent swelling. °· Only take  over-the-counter or prescription medicines for pain, discomfort, or fever as directed by your caregiver. °SEEK MEDICAL CARE IF:  °· Your shoulder pain increases, or new pain develops in your arm, hand, or fingers. °· Your hand or fingers become cold and numb. °· Your pain is not relieved with medicines. °SEEK IMMEDIATE MEDICAL CARE IF:  °· Your arm, hand, or fingers are numb or tingling. °· Your arm, hand, or fingers are significantly swollen or turn white or blue. °MAKE SURE YOU:  °· Understand these instructions. °· Will watch your condition. °· Will get help right away if you are not doing well or get worse. °Document Released: 05/30/2005 Document Revised: 01/04/2014 Document Reviewed: 08/04/2011 °ExitCare® Patient Information ©2015 [employer]. This information is not intended to replace advice given to you by your health care provider. Make sure you discuss any questions you have with your health care provider. °Cervical Radiculopathy °Cervical radiculopathy happens when a nerve in the neck is pinched or bruised by a slipped (herniated) disk or by arthritic changes in the bones of the cervical spine. This can occur due to an injury or as part of the normal aging process. Pressure on the cervical nerves can cause pain or numbness that runs from your neck all the way down into your arm and fingers. °CAUSES  °There are many possible causes, including: °· Injury. °· Muscle tightness in the neck from overuse. °· Swollen, painful joints (arthritis). °· Breakdown or degeneration in the bones   and joints of the spine (spondylosis) due to aging. °· Bone spurs that may develop near the cervical nerves. °SYMPTOMS  °Symptoms include pain, weakness, or numbness in the affected arm and hand. Pain can be severe or irritating. Symptoms may be worse when extending or turning the neck. °DIAGNOSIS  °Your caregiver will ask about your symptoms and do a physical exam. He or she may test your strength and reflexes. X-rays, CT  scans, and MRI scans may be needed in cases of injury or if the symptoms do not go away after a period of time. Electromyography (EMG) or nerve conduction testing may be done to study how your nerves and muscles are working. °TREATMENT  °Your caregiver may recommend certain exercises to help relieve your symptoms. Cervical radiculopathy can, and often does, get better with time and treatment. If your problems continue, treatment options may include: °· Wearing a soft collar for short periods of time. °· Physical therapy to strengthen the neck muscles. °· Medicines, such as nonsteroidal anti-inflammatory drugs (NSAIDs), oral corticosteroids, or spinal injections. °· Surgery. Different types of surgery may be done depending on the cause of your problems. °HOME CARE INSTRUCTIONS  °· Put ice on the affected area. °¨ Put ice in a plastic bag. °¨ Place a towel between your skin and the bag. °¨ Leave the ice on for 15-20 minutes, 03-04 times a day or as directed by your caregiver. °· If ice does not help, you can try using heat. Take a warm shower or bath, or use a hot water bottle as directed by your caregiver. °· You may try a gentle neck and shoulder massage. °· Use a flat pillow when you sleep. °· Only take over-the-counter or prescription medicines for pain, discomfort, or fever as directed by your caregiver. °· If physical therapy was prescribed, follow your caregiver's directions. °· If a soft collar was prescribed, use it as directed. °SEEK IMMEDIATE MEDICAL CARE IF:  °· Your pain gets much worse and cannot be controlled with medicines. °· You have weakness or numbness in your hand, arm, face, or leg. °· You have a high fever or a stiff, rigid neck. °· You lose bowel or bladder control (incontinence). °· You have trouble with walking, balance, or speaking. °MAKE SURE YOU:  °· Understand these instructions. °· Will watch your condition. °· Will get help right away if you are not doing well or get worse. °Document  Released: 05/15/2001 Document Revised: 11/12/2011 Document Reviewed: 04/03/2011 °ExitCare® Patient Information ©2015 [employer]. This information is not intended to replace advice given to you by your health care provider. Make sure you discuss any questions you have with your health care provider. ° °

## 2014-04-13 NOTE — ED Provider Notes (Signed)
Medical screening examination/treatment/procedure(s) were performed by non-physician practitioner and as supervising physician I was immediately available for consultation/collaboration.  Falcon Mccaskey, MD 04/13/14 1238 

## 2020-01-19 ENCOUNTER — Emergency Department (HOSPITAL_COMMUNITY): Payer: HRSA Program

## 2020-01-19 ENCOUNTER — Encounter (HOSPITAL_COMMUNITY): Payer: Self-pay | Admitting: Internal Medicine

## 2020-01-19 ENCOUNTER — Other Ambulatory Visit: Payer: Self-pay

## 2020-01-19 ENCOUNTER — Inpatient Hospital Stay (HOSPITAL_COMMUNITY)
Admission: EM | Admit: 2020-01-19 | Discharge: 2020-01-22 | DRG: 177 | Disposition: A | Discharge status: Home / Self Care | Payer: HRSA Program | Attending: Internal Medicine | Admitting: Internal Medicine

## 2020-01-19 DIAGNOSIS — Z7952 Long term (current) use of systemic steroids: Secondary | ICD-10-CM | POA: Diagnosis not present

## 2020-01-19 DIAGNOSIS — U071 COVID-19: Secondary | ICD-10-CM | POA: Diagnosis present

## 2020-01-19 DIAGNOSIS — J1282 Pneumonia due to coronavirus disease 2019: Secondary | ICD-10-CM | POA: Diagnosis present

## 2020-01-19 DIAGNOSIS — J9601 Acute respiratory failure with hypoxia: Secondary | ICD-10-CM | POA: Diagnosis present

## 2020-01-19 DIAGNOSIS — R0602 Shortness of breath: Secondary | ICD-10-CM | POA: Diagnosis present

## 2020-01-19 LAB — CBC WITH DIFFERENTIAL/PLATELET
Abs Immature Granulocytes: 0.02 10*3/uL (ref 0.00–0.07)
Basophils Absolute: 0 10*3/uL (ref 0.0–0.1)
Basophils Relative: 0 %
Eosinophils Absolute: 0 10*3/uL (ref 0.0–0.5)
Eosinophils Relative: 0 %
HCT: 53.2 % — ABNORMAL HIGH (ref 39.0–52.0)
Hemoglobin: 17.3 g/dL — ABNORMAL HIGH (ref 13.0–17.0)
Immature Granulocytes: 1 %
Lymphocytes Relative: 15 %
Lymphs Abs: 0.6 10*3/uL — ABNORMAL LOW (ref 0.7–4.0)
MCH: 29 pg (ref 26.0–34.0)
MCHC: 32.5 g/dL (ref 30.0–36.0)
MCV: 89.1 fL (ref 80.0–100.0)
Monocytes Absolute: 0.4 10*3/uL (ref 0.1–1.0)
Monocytes Relative: 9 %
Neutro Abs: 3.1 10*3/uL (ref 1.7–7.7)
Neutrophils Relative %: 75 %
Platelets: 168 10*3/uL (ref 150–400)
RBC: 5.97 MIL/uL — ABNORMAL HIGH (ref 4.22–5.81)
RDW: 12.3 % (ref 11.5–15.5)
WBC: 4.1 10*3/uL (ref 4.0–10.5)
nRBC: 0 % (ref 0.0–0.2)

## 2020-01-19 LAB — COMPREHENSIVE METABOLIC PANEL
ALT: 46 U/L — ABNORMAL HIGH (ref 0–44)
AST: 50 U/L — ABNORMAL HIGH (ref 15–41)
Albumin: 4 g/dL (ref 3.5–5.0)
Alkaline Phosphatase: 49 U/L (ref 38–126)
Anion gap: 12 (ref 5–15)
BUN: 9 mg/dL (ref 6–20)
CO2: 27 mmol/L (ref 22–32)
Calcium: 8.8 mg/dL — ABNORMAL LOW (ref 8.9–10.3)
Chloride: 99 mmol/L (ref 98–111)
Creatinine, Ser: 1.27 mg/dL — ABNORMAL HIGH (ref 0.61–1.24)
GFR calc Af Amer: >60 mL/min (ref >60)
GFR calc non Af Amer: >60 mL/min (ref >60)
Glucose, Bld: 150 mg/dL — ABNORMAL HIGH (ref 70–99)
Potassium: 4 mmol/L (ref 3.5–5.1)
Sodium: 138 mmol/L (ref 135–145)
Total Bilirubin: 0.4 mg/dL (ref 0.3–1.2)
Total Protein: 8.2 g/dL — ABNORMAL HIGH (ref 6.5–8.1)

## 2020-01-19 LAB — PROCALCITONIN: Procalcitonin: 0.17 ng/mL

## 2020-01-19 LAB — HIV ANTIBODY (ROUTINE TESTING W REFLEX): HIV Screen 4th Generation wRfx: NONREACTIVE

## 2020-01-19 LAB — D-DIMER, QUANTITATIVE: D-Dimer, Quant: 0.46 ug/mL-FEU (ref 0.00–0.50)

## 2020-01-19 LAB — FIBRINOGEN: Fibrinogen: 708 mg/dL — ABNORMAL HIGH (ref 210–475)

## 2020-01-19 LAB — SARS CORONAVIRUS 2 BY RT PCR (HOSPITAL ORDER, PERFORMED IN ~~LOC~~ HOSPITAL LAB): SARS Coronavirus 2: POSITIVE — AB

## 2020-01-19 LAB — LACTATE DEHYDROGENASE: LDH: 347 U/L — ABNORMAL HIGH (ref 98–192)

## 2020-01-19 LAB — FERRITIN: Ferritin: 851 ng/mL — ABNORMAL HIGH (ref 24–336)

## 2020-01-19 LAB — LACTIC ACID, PLASMA: Lactic Acid, Venous: 1.1 mmol/L (ref 0.5–1.9)

## 2020-01-19 LAB — C-REACTIVE PROTEIN: CRP: 6 mg/dL — ABNORMAL HIGH (ref <1.0)

## 2020-01-19 LAB — TRIGLYCERIDES: Triglycerides: 85 mg/dL (ref <150)

## 2020-01-19 MED ORDER — OXYCODONE HCL 5 MG PO TABS: 5.0000 mg | ORAL_TABLET | ORAL | Status: DC | PRN

## 2020-01-19 MED ORDER — ALBUTEROL SULFATE HFA 108 (90 BASE) MCG/ACT IN AERS
2.0000 | INHALATION_SPRAY | RESPIRATORY_TRACT | Status: DC | PRN
  Filled 2020-01-19: qty 6.7

## 2020-01-19 MED ORDER — SODIUM CHLORIDE 0.9% FLUSH
3.0000 mL | Freq: Two times a day (BID) | INTRAVENOUS | Status: DC
  Administered 2020-01-19 – 2020-01-22 (×6): 3 mL via INTRAVENOUS

## 2020-01-19 MED ORDER — BISACODYL 5 MG PO TBEC: 5.0000 mg | DELAYED_RELEASE_TABLET | Freq: Every day | ORAL | Status: DC | PRN

## 2020-01-19 MED ORDER — SODIUM CHLORIDE 0.9 % IV SOLN
200.0000 mg | Freq: Once | INTRAVENOUS | Status: AC
  Administered 2020-01-19: 200 mg via INTRAVENOUS
  Filled 2020-01-19: qty 40

## 2020-01-19 MED ORDER — DEXAMETHASONE SODIUM PHOSPHATE 10 MG/ML IJ SOLN
6.0000 mg | INTRAMUSCULAR | Status: DC
  Administered 2020-01-19 – 2020-01-22 (×4): 6 mg via INTRAVENOUS
  Filled 2020-01-19 (×4): qty 1

## 2020-01-19 MED ORDER — SODIUM CHLORIDE 0.9% FLUSH
3.0000 mL | INTRAVENOUS | Status: DC | PRN
  Administered 2020-01-19: 3 mL via INTRAVENOUS

## 2020-01-19 MED ORDER — ONDANSETRON HCL 4 MG/2ML IJ SOLN
4.0000 mg | Freq: Once | INTRAMUSCULAR | Status: AC
  Administered 2020-01-19: 4 mg via INTRAVENOUS
  Filled 2020-01-19: qty 2

## 2020-01-19 MED ORDER — ACETAMINOPHEN 325 MG PO TABS
650.0000 mg | ORAL_TABLET | Freq: Four times a day (QID) | ORAL | Status: DC | PRN
  Administered 2020-01-19: 650 mg via ORAL
  Filled 2020-01-19: qty 2

## 2020-01-19 MED ORDER — ONDANSETRON HCL 4 MG/2ML IJ SOLN: 4.0000 mg | Freq: Four times a day (QID) | INTRAMUSCULAR | Status: DC | PRN

## 2020-01-19 MED ORDER — FLEET ENEMA 7-19 GM/118ML RE ENEM: 1.0000 | ENEMA | Freq: Once | RECTAL | Status: DC | PRN

## 2020-01-19 MED ORDER — GUAIFENESIN-DM 100-10 MG/5ML PO SYRP
10.0000 mL | ORAL_SOLUTION | ORAL | Status: DC | PRN
  Filled 2020-01-19: qty 10

## 2020-01-19 MED ORDER — SODIUM CHLORIDE 0.9 % IV SOLN
1000.0000 mL | INTRAVENOUS | Status: DC
  Administered 2020-01-19: 1000 mL via INTRAVENOUS

## 2020-01-19 MED ORDER — ENOXAPARIN SODIUM 30 MG/0.3ML ~~LOC~~ SOLN
30.0000 mg | SUBCUTANEOUS | Status: DC
  Administered 2020-01-19 – 2020-01-20 (×2): 30 mg via SUBCUTANEOUS
  Filled 2020-01-19 (×2): qty 0.3

## 2020-01-19 MED ORDER — HYDROCOD POLST-CPM POLST ER 10-8 MG/5ML PO SUER: 5.0000 mL | Freq: Two times a day (BID) | ORAL | Status: DC | PRN

## 2020-01-19 MED ORDER — SODIUM CHLORIDE 0.9 % IV SOLN: 250.0000 mL | INTRAVENOUS | Status: DC | PRN

## 2020-01-19 MED ORDER — POLYETHYLENE GLYCOL 3350 17 G PO PACK: 17.0000 g | PACK | Freq: Every day | ORAL | Status: DC | PRN

## 2020-01-19 MED ORDER — ONDANSETRON HCL 4 MG PO TABS: 4.0000 mg | ORAL_TABLET | Freq: Four times a day (QID) | ORAL | Status: DC | PRN

## 2020-01-19 MED ORDER — SODIUM CHLORIDE 0.9 % IV SOLN
100.0000 mg | Freq: Every day | INTRAVENOUS | Status: DC
  Administered 2020-01-20 – 2020-01-22 (×3): 100 mg via INTRAVENOUS
  Filled 2020-01-19 (×3): qty 20

## 2020-01-19 NOTE — ED Provider Notes (Signed)
Care assumed from Dr. Wilkie Aye at shift change.  Patient with recently diagnosed COVID-19 presenting with vomiting and shortness of breath.  He was found to be hypoxic with oxygen saturations of 85% on room air.  His chest x-ray shows Covid pneumonia and laboratory studies show elevations of his inflammatory markers.  Patient will require admission.  I spoken with Dr. Ophelia Charter who agrees to admit.   Geoffery Lyons, MD 01/19/20 [phone]

## 2020-01-19 NOTE — ED Notes (Signed)
Lunch Tray Ordered @ 1110. 

## 2020-01-19 NOTE — ED Notes (Signed)
Ordered lunch tray 

## 2020-01-19 NOTE — Progress Notes (Signed)
Ricky Keith [id number] Admission Data: 01/19/2020 2:57 PM Attending Provider: Jonah Blue, MD  [id number], No Pcp Per Consults/ Treatment Team:   Deniro Laymon is a 39 y.o. male patient admitted from ED awake, alert  & orientated  X 3,  Full Code, VSS - Blood pressure 135/88, pulse 93, temperature 99.4 F (37.4 C), temperature source Oral, resp. rate 15, SpO2 93 %., O2    2 L nasal cannular, no c/o shortness of breath, no c/o chest pain, no distress noted. Tele # 1 placed and pt is currently running:normal sinus rhythm.   IV site WDL:  wrist left, condition patent and no redness with a transparent dsg that's clean dry and intact.  Allergies:  No Known Allergies  Pt orientation to unit, room and routine. Information packet given to patient/family and safety video watched.  Admission INP armband ID verified with patient/family, and in place. SR up x 2, fall risk assessment complete with Patient and family verbalizing understanding of risks associated with falls. Pt verbalizes an understanding of how to use the call bell and to call for help before getting out of bed.  Skin, clean-dry- intact without evidence of bruising, or skin tears.   No evidence of skin break down noted on exam.    Will cont to monitor and assist as needed.  Phineas Douglas, [place] 01/19/2020 2:57 PM

## 2020-01-19 NOTE — ED Provider Notes (Signed)
[facility] EMERGENCY DEPARTMENT Provider Note   CSN: [id number] Arrival date & time: 01/19/20  0401     History Chief Complaint  Patient presents with  . Cough  . Nausea    Ricky Keith is a 39 y.o. male.  HPI     This is a 39 year old male with no significant past medical history who presents with vomiting and cough.  Patient reports that he tested positive for COVID-19 last Monday.  He has had ongoing cough, nausea, and diarrhea.  He states he cannot keep anything down.  Denies any abdominal pain.  He tried over-the-counter medications without relief.  He was found to have O2 sats of 85% on room air.  He reports a cough that is nonproductive but no significant shortness of breath or chest pain.  Past Medical History:  Diagnosis Date  . Inguinal hernia 03/2013   left    Patient Active Problem List   Diagnosis Date Noted  . Left inguinal hernia 03/09/2013    Past Surgical History:  Procedure Laterality Date  . INGUINAL HERNIA REPAIR Left 03/17/2013   Procedure: LEFT INGUINAL HERNIA REPAIR;  Surgeon: Marcello Moores A. Cornett, MD;  Location: [place];  Service: General;  Laterality: Left;  . INSERTION OF MESH Left 03/17/2013   Procedure: INSERTION OF MESH;  Surgeon: Joyice Faster. Cornett, MD;  Location: [facility];  Service: General;  Laterality: Left;       Family History  Problem Relation Age of Onset  . Hypertension Mother     Social History   Tobacco Use  . Smoking status: Never Smoker  . Smokeless tobacco: Never Used  Substance Use Topics  . Alcohol use: No  . Drug use: No    Home Medications Prior to Admission medications   Medication Sig Start Date End Date Taking? Authorizing Provider  HYDROcodone-acetaminophen (NORCO/VICODIN) 5-325 MG per tablet Take 2 tablets by mouth every 6 (six) hours as needed. 04/11/14   Montine Circle, PA-C  predniSONE (DELTASONE) 20 MG tablet Take 2 tablets (40 mg total) by mouth  daily. 04/11/14   Montine Circle, PA-C    Allergies    Patient has no known allergies.  Review of Systems   Review of Systems  Constitutional: Positive for fever. Negative for chills.  Respiratory: Positive for cough. Negative for shortness of breath.   Cardiovascular: Negative for chest pain.  Gastrointestinal: Positive for diarrhea, nausea and vomiting. Negative for abdominal pain.  Genitourinary: Negative for dysuria.  All other systems reviewed and are negative.   Physical Exam Updated Vital Signs BP 135/88 (BP Location: Right Arm)   Pulse (!) 106   Temp 99.9 F (37.7 C) (Oral)   Resp 16   SpO2 92%   Physical Exam Vitals and nursing note reviewed.  Constitutional:      Appearance: He is well-developed. He is not ill-appearing.  HENT:     Head: Normocephalic and atraumatic.     Nose: Nose normal.     Mouth/Throat:     Mouth: Mucous membranes are moist.  Eyes:     Pupils: Pupils are equal, round, and reactive to light.  Cardiovascular:     Rate and Rhythm: Normal rate and regular rhythm.     Heart sounds: Normal heart sounds. No murmur.  Pulmonary:     Effort: Pulmonary effort is normal. No respiratory distress.     Breath sounds: Normal breath sounds. No wheezing.     Comments: Nasal cannula in place, no  respiratory distress noted Abdominal:     General: Bowel sounds are normal.     Palpations: Abdomen is soft.     Tenderness: There is no abdominal tenderness. There is no rebound.  Musculoskeletal:     Cervical back: Neck supple.     Right lower leg: No edema.     Left lower leg: No edema.  Skin:    General: Skin is warm and dry.  Neurological:     Mental Status: He is alert and oriented to person, place, and time.  Psychiatric:        Mood and Affect: Mood normal.     ED Results / Procedures / Treatments   Labs (all labs ordered are listed, but only abnormal results are displayed) Labs Reviewed  CBC WITH DIFFERENTIAL/PLATELET - Abnormal; Notable for  the following components:      Result Value   RBC 5.97 (*)    Hemoglobin 17.3 (*)    HCT 53.2 (*)    Lymphs Abs 0.6 (*)    All other components within normal limits  SARS CORONAVIRUS 2 BY RT PCR (HOSPITAL ORDER, PERFORMED IN [facility])  CULTURE, BLOOD (ROUTINE X 2)  CULTURE, BLOOD (ROUTINE X 2)  LACTIC ACID, PLASMA  LACTIC ACID, PLASMA  COMPREHENSIVE METABOLIC PANEL  D-DIMER, QUANTITATIVE (NOT AT [facility])  PROCALCITONIN  LACTATE DEHYDROGENASE  FERRITIN  TRIGLYCERIDES  FIBRINOGEN  C-REACTIVE PROTEIN    EKG None  Radiology DG Chest Port 1 View  Result Date: 01/19/2020 CLINICAL DATA:  COVID pneumonia. Shortness of breath EXAM: PORTABLE CHEST 1 VIEW COMPARISON:  None. FINDINGS: The cardiac silhouette, mediastinal and hilar contours are within normal limits. Low lung volumes. Patchy, hazy bilateral infiltrates consistent with COVID pneumonia. No pleural effusions. No pneumothorax. The bony thorax is intact. IMPRESSION: Bilateral infiltrates consistent with COVID pneumonia. Electronically Signed   By: Rudie Meyer M.D.   On: 01/19/2020 06:33    Procedures Procedures (including critical care time)  CRITICAL CARE Performed by: Shon Baton   Total critical care time: 35 minutes  Critical care time was exclusive of separately billable procedures and treating other patients.  Critical care was necessary to treat or prevent imminent or life-threatening deterioration.  Critical care was time spent personally by me on the following activities: development of treatment plan with patient and/or surrogate as well as nursing, discussions with consultants, evaluation of patient's response to treatment, examination of patient, obtaining history from patient or surrogate, ordering and performing treatments and interventions, ordering and review of laboratory studies, ordering and review of radiographic studies, pulse oximetry and re-evaluation of patient's  condition.  Medications Ordered in ED Medications  0.9 %  sodium chloride infusion (1,000 mLs Intravenous New Bag/Given 01/19/20 0650)  ondansetron (ZOFRAN) injection 4 mg (4 mg Intravenous Given 01/19/20 7829)    ED Course  I have reviewed the triage vital signs and the nursing notes.  Pertinent labs & imaging results that were available during my care of the patient were reviewed by me and considered in my medical decision making (see chart for details).    MDM Rules/Calculators/A&P                      Patient presents with ongoing fevers, nausea, vomiting, cough.  Nontoxic-appearing.  Was noted to be 85% by EMS on room air.  Was placed on nasal cannula.  He is in no respiratory distress.  Satting low 90s on 2 L of nasal cannula.  His physical exam is fairly benign.  Chest x-ray is consistent with COVID-19 pneumonia.  Repeat Covid testing sent as I do not have a documented positive test.  Additionally, lab work is pending.  Suspect patient will require admission for supplemental oxygen.  He was given some fluids and Zofran given his vomiting.  Patient signed out to oncoming provider.  Final Clinical Impression(s) / ED Diagnoses Final diagnoses:  Pneumonia due to COVID-19 virus  Acute respiratory failure with hypoxia [facility])    Rx / DC Orders ED Discharge Orders    None       Shon Baton, MD 01/19/20 [phone]

## 2020-01-19 NOTE — H&P (Signed)
History and Physical    [name] VOH:[id number] DOB: 07-May-1981 DOA: 01/19/2020  PCP: Patient, No Pcp Per Consultants:  None Patient coming from: Home - lives with wife and 4 children; NOK: Wife, Ricky Keith, [phone]  Chief Complaint:  Worsening COVID symptoms  HPI: Ricky Keith is a 39 y.o. male with no significant medical history presenting with worsening COVID symptoms. He reports onset of fever, myalgias, cough last Monday (8 days ago).  His son is in school and tested positive for COVID and the household was subsequently tested and all were positive.  He has had progressive nausea and anorexia.  Also with SOB associated with cough.   ED Course:  N/V, worsening COVID.  85% on RA, mid-90s on 2L.  Needs admission.  Review of Systems: As per HPI; otherwise review of systems reviewed and negative.   Ambulatory Status:  Ambulates without assistance  COVID Vaccine Status:  None   History reviewed. No pertinent past medical history.  Past Surgical History:  Procedure Laterality Date  . INGUINAL HERNIA REPAIR Left 03/17/2013   Procedure: LEFT INGUINAL HERNIA REPAIR;  Surgeon: Maisie Fus A. Cornett, MD;  Location: [facility];  Service: General;  Laterality: Left;  . INSERTION OF MESH Left 03/17/2013   Procedure: INSERTION OF MESH;  Surgeon: Clovis Pu. Cornett, MD;  Location: [facility];  Service: General;  Laterality: Left;    Social History   Socioeconomic History  . Marital status: Married    Spouse name: Not on file  . Number of children: Not on file  . Years of education: Not on file  . Highest education level: Not on file  Occupational History  . Occupation: Psychologist, occupational  Tobacco Use  . Smoking status: Never Smoker  . Smokeless tobacco: Never Used  Substance and Sexual Activity  . Alcohol use: No  . Drug use: No  . Sexual activity: Yes    Birth control/protection: None  Other Topics Concern  . Not on file  Social History Narrative  .  Not on file   Social Determinants of Health   Financial Resource Strain:   . Difficulty of Paying Living Expenses:   Food Insecurity:   . Worried About Programme researcher, broadcasting/film/video in the Last Year:   . Barista in the Last Year:   Transportation Needs:   . Freight forwarder (Medical):   Marland Kitchen Lack of Transportation (Non-Medical):   Physical Activity:   . Days of Exercise per Week:   . Minutes of Exercise per Session:   Stress:   . Feeling of Stress :   Social Connections:   . Frequency of Communication with Friends and Family:   . Frequency of Social Gatherings with Friends and Family:   . Attends Religious Services:   . Active Member of Clubs or Organizations:   . Attends Banker Meetings:   Marland Kitchen Marital Status:   Intimate Partner Violence:   . Fear of Current or Ex-Partner:   . Emotionally Abused:   Marland Kitchen Physically Abused:   . Sexually Abused:     No Known Allergies  Family History  Problem Relation Age of Onset  . Hypertension Mother     Prior to Admission medications   Medication Sig Start Date End Date Taking? Authorizing Provider  HYDROcodone-acetaminophen (NORCO/VICODIN) 5-325 MG per tablet Take 2 tablets by mouth every 6 (six) hours as needed. 04/11/14   Roxy Horseman, PA-C  predniSONE (DELTASONE) 20 MG tablet Take 2 tablets (40  mg total) by mouth daily. 04/11/14   Roxy Horseman, PA-C    Physical Exam: Vitals:   01/19/20 0615 01/19/20 0815 01/19/20 0826 01/19/20 1023  BP: (!) 147/88 125/85  (!) 133/94  Pulse: 89 88  76  Resp: (!) 26 (!) 23  16  Temp:   100.1 F (37.8 C) 100 F (37.8 C)  TempSrc:    Oral  SpO2: 97% 96%  97%     . General:  Appears calm and comfortable and is NAD, on 2L [place] O2 . Eyes:  PERRL, EOMI, normal lids, iris . ENT:  grossly normal hearing, lips & tongue, mmm; appropriate dentition . Neck:  no LAD, masses or thyromegaly . Cardiovascular:  RRR, no m/r/g. No LE edema.  Marland Kitchen Respiratory:   Mild and scattered diffuse  rhonchi.  Mildly increased respiratory effort. . Abdomen:  soft, NT, ND, NABS . Skin:  no rash or induration seen on limited exam . Musculoskeletal:  grossly normal tone BUE/BLE, good ROM, no bony abnormality . Psychiatric:  grossly normal mood and affect, speech fluent and appropriate, AOx3 . Neurologic:  CN 2-12 grossly intact, moves all extremities in coordinated fashion    Radiological Exams on Admission: DG Chest Port 1 View  Result Date: 01/19/2020 CLINICAL DATA:  COVID pneumonia. Shortness of breath EXAM: PORTABLE CHEST 1 VIEW COMPARISON:  None. FINDINGS: The cardiac silhouette, mediastinal and hilar contours are within normal limits. Low lung volumes. Patchy, hazy bilateral infiltrates consistent with COVID pneumonia. No pleural effusions. No pneumothorax. The bony thorax is intact. IMPRESSION: Bilateral infiltrates consistent with COVID pneumonia. Electronically Signed   By: Rudie Meyer M.D.   On: 01/19/2020 06:33    EKG: Independently reviewed.  NSR with rate 89; no evidence of acute ischemia   Labs on Admission: I have personally reviewed the available labs and imaging studies at the time of the admission.  Pertinent labs:   Glucose 150 BUN 9/Creatinine 1.27/GFR >60 AST 50/ALT 46 LDH 347 Ferritin 851 CRP 6.0 Lactate 1.1 WBC 4.1, lymphopenia noted Hgb 17.3 D-dimer 0.46 Fibrinogen 708 Procalcitonin 0.17 COVID POSITIVE   Assessment/Plan Principal Problem:   Acute hypoxemic respiratory failure due to COVID-19 [facility])   Acute respiratory failure with hypoxia associated with COVID-19 PNA -Patient with presenting with SOB and persistent nausea with known COVID-19 infection -He does not have a usual home O2 requirement and is currently requiring 2L [place] O2 -COVID POSITIVE -The patient has no known comorbidities which may increase the risk for ARDS/MODS -Pertinent labs concerning for COVID include lymphopenia; increased BUN/Creatinine; increased LFTs; increased LDH;  increased ferritin; low procalcitonin; elevated CRP (not >7); increased fibrinogen -CXR with multifocal opacities which may be c/w COVID vs. Multifocal PNA -Will not treat with broad-spectrum antibiotics given procalcitonin <0.1 -Will admit for further evaluation, close monitoring, and treatment -Monitor on telemetry x at least 24 hours -At this time, will attempt to avoid use of aerosolized medications and use HFAs instead -Will check daily labs including BMP with Mag, Phos; LFTs; CBC with differential; CRP; ferritin; fibrinogen; D-dimer -Will order steroids and Remdesivir (pharmacy consult) given +COVID test, +CXR, and hypoxia <94% on room air -If the patient shows clinical deterioration, consider transfer to ICU with PCCM consultation -Consider Tocilizumab and/or convalescent plasma if the patient does not stabilize on current treatment or if the patient has marked clinical decompensation; the patient does not appear to require these treatments at this time. -Will attempt to maintain euvolemia to a net negative fluid status -Will ask the  patient to maintain an awake prone position for 16+ hours a day, if possible, with a minimum of 2-3 hours at a time -With D-dimer <5, will use standard-dosed Lovenox for DVT prevention -Patient was seen wearing full PPE including: gown, gloves, head cover, N95, and face shield; donning and doffing was in compliance with current standards.    DVT prophylaxis:  Lovenox  Code Status:  Full - confirmed with patient Family Communication: None present; I spoke with the patient's wife by telephone. Disposition Plan:  The patient is from: home  Anticipated d/c is to: home without [facility] services once his respiratory issues have been resolved. He may require home O2 at the time of discharge.  Anticipated d/c date will depend on clinical response to treatment, likely between 3 days (with completion of outpatient Remdesivir treatment) and 5 days  Patient is currently:  acutely ill Consults called: None  Admission status: Admit - It is my clinical opinion that admission to INPATIENT is reasonable and necessary because of the expectation that this patient will require hospital care that crosses at least 2 midnights to treat this condition based on the medical complexity of the problems presented.  Given the aforementioned information, the predictability of an adverse outcome is felt to be significant.     Karmen Bongo MD [facility]   How to contact the [facility] Attending or Consulting provider [place] or covering provider during after hours [place], for this patient?  1. Check the care team in [facility] and look for a) attending/consulting TR[facility] provider listed and b) the [facility] team listed 2. Log into [email or web address] and use [place]'s universal password to access. If you do not have the password, please contact the hospital operator. 3. Locate the [facility] provider you are looking for under [facility] and page to a number that you can be directly reached. 4. If you still have difficulty reaching the provider, please page the [facility] (Director on Call) for the Hospitalists listed on amion for assistance.   01/19/2020, 10:27 AM

## 2020-01-19 NOTE — ED Triage Notes (Signed)
To ED for eval of cough, nausea, and diarrhea. Tested positive for Covid last Tuesday. Taking otc meds. Found to have sats of 85% on RA with EMS. Arrived on 2L O2 via [place].

## 2020-01-20 LAB — CBC WITH DIFFERENTIAL/PLATELET
Abs Immature Granulocytes: 0.01 10*3/uL (ref 0.00–0.07)
Basophils Absolute: 0 10*3/uL (ref 0.0–0.1)
Basophils Relative: 0 %
Eosinophils Absolute: 0 10*3/uL (ref 0.0–0.5)
Eosinophils Relative: 0 %
HCT: 48 % (ref 39.0–52.0)
Hemoglobin: 15.6 g/dL (ref 13.0–17.0)
Immature Granulocytes: 0 %
Lymphocytes Relative: 24 %
Lymphs Abs: 0.8 10*3/uL (ref 0.7–4.0)
MCH: 28.8 pg (ref 26.0–34.0)
MCHC: 32.5 g/dL (ref 30.0–36.0)
MCV: 88.6 fL (ref 80.0–100.0)
Monocytes Absolute: 0.5 10*3/uL (ref 0.1–1.0)
Monocytes Relative: 14 %
Neutro Abs: 2.2 10*3/uL (ref 1.7–7.7)
Neutrophils Relative %: 62 %
Platelets: 182 10*3/uL (ref 150–400)
RBC: 5.42 MIL/uL (ref 4.22–5.81)
RDW: 12.4 % (ref 11.5–15.5)
WBC: 3.6 10*3/uL — ABNORMAL LOW (ref 4.0–10.5)
nRBC: 0 % (ref 0.0–0.2)

## 2020-01-20 LAB — COMPREHENSIVE METABOLIC PANEL
ALT: 47 U/L — ABNORMAL HIGH (ref 0–44)
AST: 46 U/L — ABNORMAL HIGH (ref 15–41)
Albumin: 3.3 g/dL — ABNORMAL LOW (ref 3.5–5.0)
Alkaline Phosphatase: 42 U/L (ref 38–126)
Anion gap: 11 (ref 5–15)
BUN: 10 mg/dL (ref 6–20)
CO2: 27 mmol/L (ref 22–32)
Calcium: 8.4 mg/dL — ABNORMAL LOW (ref 8.9–10.3)
Chloride: 101 mmol/L (ref 98–111)
Creatinine, Ser: 0.97 mg/dL (ref 0.61–1.24)
GFR calc Af Amer: >60 mL/min (ref >60)
GFR calc non Af Amer: >60 mL/min (ref >60)
Glucose, Bld: 128 mg/dL — ABNORMAL HIGH (ref 70–99)
Potassium: 4.2 mmol/L (ref 3.5–5.1)
Sodium: 139 mmol/L (ref 135–145)
Total Bilirubin: 0.7 mg/dL (ref 0.3–1.2)
Total Protein: 7.3 g/dL (ref 6.5–8.1)

## 2020-01-20 LAB — MAGNESIUM: Magnesium: 2.3 mg/dL (ref 1.7–2.4)

## 2020-01-20 LAB — C-REACTIVE PROTEIN: CRP: 6.4 mg/dL — ABNORMAL HIGH (ref <1.0)

## 2020-01-20 LAB — PHOSPHORUS: Phosphorus: 3.3 mg/dL (ref 2.5–4.6)

## 2020-01-20 LAB — D-DIMER, QUANTITATIVE: D-Dimer, Quant: 0.38 ug/mL-FEU (ref 0.00–0.50)

## 2020-01-20 LAB — FERRITIN: Ferritin: 682 ng/mL — ABNORMAL HIGH (ref 24–336)

## 2020-01-20 NOTE — Plan of Care (Signed)
  Problem: Respiratory: Goal: Will maintain a patent airway Outcome: Progressing Goal: Complications related to the disease process, condition or treatment will be avoided or minimized Outcome: Progressing   

## 2020-01-20 NOTE — Progress Notes (Addendum)
PROGRESS NOTE                                                                                                                                                                                                             Patient Demographics:    Ricky Keith, is a 39 y.o. male, DOB - September 02, 1981, KDT:[id number]  Outpatient Primary MD for the patient is Patient, No Pcp Per   Admit date - 01/19/2020   LOS - 1  Chief Complaint  Patient presents with  . Cough  . Nausea       Brief Narrative: Patient is a 39 y.o. male with no significant past medical history-presenting with acute hypoxic respiratory failure secondary to COVID-19 pneumonia.    Per history-patient tested positive on 5/10-after exposure to his 54 year old son.  Significant Events: 5/18: Admit to [facility] for hypoxia secondary to Covid pneumonia  COVID-19 medications: Steroids: 5/18>> Remdesivir: 5/18>>  Antibiotics: None  Microbiology data: 5/18: Blood cultures>> no growth  Significant studies: 5/18: Chest x-ray>> bilateral infiltrates consistent with COVID-19  DVT prophylaxis: SQ heparin/SQ Lovenox  Procedures: None  Consults: None    Subjective:    Romie Minus today still remains hypoxic requiring anywhere from 2-3 L of oxygen to maintain O2 saturations.   Assessment  & Plan :   Acute Hypoxic Resp Failure due to Covid 19 Viral pneumonia: Still hypoxic requiring anywhere from 2-3 L of oxygen this morning-CRP remains elevated.  Plans are to continue steroids and remdesivir-follow clinical trajectory/inflammatory markers and slowly attempt to titrate down FiO2.  Fever: afebrile Prone/Incentive Spirometry: encouraged incentive spirometry use 3-4/hour. O2 requirements:  SpO2: 94 % O2 Flow Rate (L/min): 2 L/min   COVID-19 Labs: Recent Labs    01/19/20 0700 01/20/20 0646  DDIMER 0.46 0.38  FERRITIN 851* 682*  LDH 347*  --   CRP 6.0*  6.4*    No results found for: BNP  Recent Labs  Lab 01/19/20 0700  PROCALCITON 0.17    Lab Results  Component Value Date   SARSCOV2NAA POSITIVE (A) 01/19/2020     Transaminitis: Mild-likely second to COVID-19-stable for follow-up.  ABG: No results found for: PHART, PCO2ART, PO2ART, HCO3, TCO2, ACIDBASEDEF, O2SAT  Vent Settings: N/A  Condition - Stable  Family Communication  :  Spouse updated over the phone [phone]) on 5/19  Code Status :  Full Code  Diet :  Diet Order            Diet regular Room service appropriate? Yes; Fluid consistency: Thin  Diet effective now               Disposition Plan  :   Status is: Inpatient  Remains inpatient appropriate because:Inpatient level of care appropriate due to severity of illness  Dispo: The patient is from: Home              Anticipated d/c is to: Home              Anticipated d/c date is: 2 days              Patient currently is not medically stable to d/c.  Barriers to discharge: Hypoxia requiring O2 supplementation/complete 5 days of IV Remdesivir  Antimicorbials  :    Anti-infectives (From admission, onward)   Start     Dose/Rate Route Frequency Ordered Stop   01/20/20 1000  remdesivir 100 mg in sodium chloride 0.9 % 100 mL IVPB     100 mg 200 mL/hr over 30 Minutes Intravenous Daily 01/19/20 1004 01/24/20 0959   01/19/20 1100  remdesivir 200 mg in sodium chloride 0.9% 250 mL IVPB     200 mg 580 mL/hr over 30 Minutes Intravenous Once 01/19/20 1004 01/19/20 1258      Inpatient Medications  Scheduled Meds: . dexamethasone (DECADRON) injection  6 mg Intravenous Q24H  . enoxaparin (LOVENOX) injection  30 mg Subcutaneous Q24H  . sodium chloride flush  3 mL Intravenous Q12H   Continuous Infusions: . sodium chloride    . remdesivir 100 mg in NS 100 mL 100 mg (01/20/20 0935)   PRN Meds:.sodium chloride, acetaminophen, albuterol, bisacodyl, chlorpheniramine-HYDROcodone, guaiFENesin-dextromethorphan,  ondansetron **OR** ondansetron (ZOFRAN) IV, oxyCODONE, polyethylene glycol, sodium chloride flush, sodium phosphate   Time Spent in minutes  25  See all Orders from today for further details   Jeoffrey Massed M.D on 01/20/2020 at 10:52 AM  To page go to [email or web address] - use universal password  [facility] -  Office  [phone]    Objective:   Vitals:   01/19/20 1436 01/19/20 2355 01/20/20 0600 01/20/20 0732  BP: 135/88 (!) 127/97 130/85 124/77  Pulse: 93 74 71 71  Resp: 15 14 19 20   Temp: 99.4 F (37.4 C) 97.6 F (36.4 C) 99 F (37.2 C) 98.9 F (37.2 C)  TempSrc: Oral Oral Oral Oral  SpO2: 93% 92% 90% 94%    Wt Readings from Last 3 Encounters:  04/11/14 83.9 kg  04/03/13 83 kg  03/17/13 79 kg     Intake/Output Summary (Last 24 hours) at 01/20/2020 1052 Last data filed at 01/20/2020 0944 Gross per 24 hour  Intake 669.16 ml  Output 300 ml  Net 369.16 ml     Physical Exam Gen Exam:Alert awake-not in any distress HEENT:atraumatic, normocephalic Chest: B/L clear to auscultation anteriorly CVS:S1S2 regular Abdomen:soft non tender, non distended Extremities:no edema Neurology: Non focal Skin: no rash   Data Review:    CBC Recent Labs  Lab 01/19/20 0700 01/20/20 0646  WBC 4.1 3.6*  HGB 17.3* 15.6  HCT 53.2* 48.0  PLT 168 182  MCV 89.1 88.6  MCH 29.0 28.8  MCHC 32.5 32.5  RDW 12.3 12.4  LYMPHSABS 0.6* 0.8  MONOABS 0.4 0.5  EOSABS 0.0 0.0  BASOSABS 0.0 0.0    Chemistries  Recent Labs  Lab 01/19/20 0700 01/20/20 0646  NA  138 139  K 4.0 4.2  CL 99 101  CO2 27 27  GLUCOSE 150* 128*  BUN 9 10  CREATININE 1.27* 0.97  CALCIUM 8.8* 8.4*  MG  --  2.3  AST 50* 46*  ALT 46* 47*  ALKPHOS 49 42  BILITOT 0.4 0.7   ------------------------------------------------------------------------------------------------------------------ Recent Labs    01/19/20 0701  TRIG 85    No results found for:  HGBA1C ------------------------------------------------------------------------------------------------------------------ No results for input(s): TSH, T4TOTAL, T3FREE, THYROIDAB in the last 72 hours.  Invalid input(s): FREET3 ------------------------------------------------------------------------------------------------------------------ Recent Labs    01/19/20 0700 01/20/20 0646  FERRITIN 851* 682*    Coagulation profile No results for input(s): INR, PROTIME in the last 168 hours.  Recent Labs    01/19/20 0700 01/20/20 0646  DDIMER 0.46 0.38    Cardiac Enzymes No results for input(s): CKMB, TROPONINI, MYOGLOBIN in the last 168 hours.  Invalid input(s): CK ------------------------------------------------------------------------------------------------------------------ No results found for: BNP  Micro Results Recent Results (from the past 240 hour(s))  Blood Culture (routine x 2)     Status: None (Preliminary result)   Collection Time: 01/19/20  6:38 AM   Specimen: BLOOD  Result Value Ref Range Status   Specimen Description BLOOD RIGHT ANTECUBITAL  Final   Special Requests   Final    BOTTLES DRAWN AEROBIC AND ANAEROBIC Blood Culture results may not be optimal due to an inadequate volume of blood received in culture bottles   Culture   Final    NO GROWTH 1 DAY Performed at [facility], [place] [address] [place], [place] [address]    Report Status PENDING  Incomplete  Blood Culture (routine x 2)     Status: None (Preliminary result)   Collection Time: 01/19/20  6:47 AM   Specimen: BLOOD  Result Value Ref Range Status   Specimen Description BLOOD LEFT WRIST  Final   Special Requests   Final    BOTTLES DRAWN AEROBIC AND ANAEROBIC Blood Culture results may not be optimal due to an inadequate volume of blood received in culture bottles   Culture   Final    NO GROWTH 1 DAY Performed at [facility], [place] [address] [place], [place] [address]    Report  Status PENDING  Incomplete  SARS Coronavirus 2 by RT PCR (hospital order, performed in [facility] hospital lab) Nasopharyngeal Nasopharyngeal Swab     Status: Abnormal   Collection Time: 01/19/20  6:51 AM   Specimen: Nasopharyngeal Swab  Result Value Ref Range Status   SARS Coronavirus 2 POSITIVE (A) NEGATIVE Final    Comment: RESULT CALLED TO, READ BACK BY AND VERIFIED WITH: Marzella Schlein RN 8:40 01/19/20 ([email or web address]) (NOTE) SARS-CoV-2 target nucleic acids are DETECTED SARS-CoV-2 RNA is generally detectable in upper respiratory specimens  during the acute phase of infection.  Positive results are indicative  of the presence of the identified virus, but do not rule out bacterial infection or co-infection with other pathogens not detected by the test.  Clinical correlation with patient history and  other diagnostic information is necessary to determine patient infection status.  The expected result is negative. Fact Sheet for Patients:   StrictlyIdeas.no  Fact Sheet for Healthcare Providers:   BankingDealers.co.za   This test is not yet approved or cleared by the [place] FDA and  has been authorized for detection and/or diagnosis of SARS-CoV-2 by FDA under an Emergency Use Authorization (EUA).  This EUA will remain in effect (meaning this test can  be used) for the duration of  the COVID-19 declaration under Section 564(b)(1) of the Act, 21 U.S.C. section 360-bbb-3(b)(1), unless the authorization is terminated or revoked sooner. Performed at [facility], [address] [place], [place] [address]     Radiology Reports DG Chest [place] 1 View  Result Date: 01/19/2020 CLINICAL DATA:  COVID pneumonia. Shortness of breath EXAM: PORTABLE CHEST 1 VIEW COMPARISON:  None. FINDINGS: The cardiac silhouette, mediastinal and hilar contours are within normal limits. Low lung volumes. Patchy, hazy bilateral infiltrates consistent with COVID  pneumonia. No pleural effusions. No pneumothorax. The bony thorax is intact. IMPRESSION: Bilateral infiltrates consistent with COVID pneumonia. Electronically Signed   By: Rudie Meyer M.D.   On: 01/19/2020 06:33

## 2020-01-21 LAB — CBC WITH DIFFERENTIAL/PLATELET
Abs Immature Granulocytes: 0.02 10*3/uL (ref 0.00–0.07)
Basophils Absolute: 0 10*3/uL (ref 0.0–0.1)
Basophils Relative: 0 %
Eosinophils Absolute: 0 10*3/uL (ref 0.0–0.5)
Eosinophils Relative: 0 %
HCT: 49.2 % (ref 39.0–52.0)
Hemoglobin: 16.1 g/dL (ref 13.0–17.0)
Immature Granulocytes: 0 %
Lymphocytes Relative: 18 %
Lymphs Abs: 1 10*3/uL (ref 0.7–4.0)
MCH: 28.8 pg (ref 26.0–34.0)
MCHC: 32.7 g/dL (ref 30.0–36.0)
MCV: 87.9 fL (ref 80.0–100.0)
Monocytes Absolute: 0.5 10*3/uL (ref 0.1–1.0)
Monocytes Relative: 9 %
Neutro Abs: 3.9 10*3/uL (ref 1.7–7.7)
Neutrophils Relative %: 73 %
Platelets: 219 10*3/uL (ref 150–400)
RBC: 5.6 MIL/uL (ref 4.22–5.81)
RDW: 12.3 % (ref 11.5–15.5)
WBC: 5.4 10*3/uL (ref 4.0–10.5)
nRBC: 0 % (ref 0.0–0.2)

## 2020-01-21 LAB — COMPREHENSIVE METABOLIC PANEL
ALT: 42 U/L (ref 0–44)
AST: 35 U/L (ref 15–41)
Albumin: 3.1 g/dL — ABNORMAL LOW (ref 3.5–5.0)
Alkaline Phosphatase: 39 U/L (ref 38–126)
Anion gap: 9 (ref 5–15)
BUN: 13 mg/dL (ref 6–20)
CO2: 28 mmol/L (ref 22–32)
Calcium: 8.4 mg/dL — ABNORMAL LOW (ref 8.9–10.3)
Chloride: 102 mmol/L (ref 98–111)
Creatinine, Ser: 0.91 mg/dL (ref 0.61–1.24)
GFR calc Af Amer: >60 mL/min (ref >60)
GFR calc non Af Amer: >60 mL/min (ref >60)
Glucose, Bld: 148 mg/dL — ABNORMAL HIGH (ref 70–99)
Potassium: 3.7 mmol/L (ref 3.5–5.1)
Sodium: 139 mmol/L (ref 135–145)
Total Bilirubin: 0.5 mg/dL (ref 0.3–1.2)
Total Protein: 6.7 g/dL (ref 6.5–8.1)

## 2020-01-21 LAB — C-REACTIVE PROTEIN: CRP: 3.1 mg/dL — ABNORMAL HIGH (ref <1.0)

## 2020-01-21 LAB — D-DIMER, QUANTITATIVE: D-Dimer, Quant: 0.46 ug/mL-FEU (ref 0.00–0.50)

## 2020-01-21 LAB — FERRITIN: Ferritin: 666 ng/mL — ABNORMAL HIGH (ref 24–336)

## 2020-01-21 MED ORDER — ENOXAPARIN SODIUM 40 MG/0.4ML ~~LOC~~ SOLN
40.0000 mg | SUBCUTANEOUS | Status: DC
  Administered 2020-01-21 – 2020-01-22 (×2): 40 mg via SUBCUTANEOUS
  Filled 2020-01-21 (×2): qty 0.4

## 2020-01-21 NOTE — Progress Notes (Signed)
PROGRESS NOTE                                                                                                                                                                                                             Patient Demographics:    Ricky Keith, is a 39 y.o. male, DOB - 02/19/1981, TMH:[id number]  Outpatient Primary MD for the patient is Patient, No Pcp Per   Admit date - 01/19/2020   LOS - 2  Chief Complaint  Patient presents with  . Cough  . Nausea       Brief Narrative: Patient is a 39 y.o. male with no significant past medical history-presenting with acute hypoxic respiratory failure secondary to COVID-19 pneumonia.    Per history-patient tested positive on 5/10-after exposure to his 17 year old son.  Significant Events: 5/18: Admit to [facility] for hypoxia secondary to Covid pneumonia  COVID-19 medications: Steroids: 5/18>> Remdesivir: 5/18>>  Antibiotics: None  Microbiology data: 5/18: Blood cultures>> no growth  Significant studies: 5/18: Chest x-ray>> bilateral infiltrates consistent with COVID-19  DVT prophylaxis: SQ heparin/SQ Lovenox  Procedures: None  Consults: None    Subjective:   She feels better-he was titrated to room air early this morning.   Assessment  & Plan :   Acute Hypoxic Resp Failure due to Covid 19 Viral pneumonia: Hypoxemia has improved-he was titrated to room air this morning-CRP trending down.  Continue to monitor closely-remains on steroids and remdesivir.  If he continues to improve-he would be a good candidate for outpatient remdesivir infusion.  Continue inpatient monitoring and see how he does overnight.  Fever: afebrile Prone/Incentive Spirometry: encouraged incentive spirometry use 3-4/hour. O2 requirements:  SpO2: 100 % O2 Flow Rate (L/min): 2 L/min   COVID-19 Labs: Recent Labs    01/19/20 0700 01/20/20 0646 01/21/20 0539  DDIMER 0.46 0.38  0.46  FERRITIN 851* 682* 666*  LDH 347*  --   --   CRP 6.0* 6.4* 3.1*    No results found for: BNP  Recent Labs  Lab 01/19/20 0700  PROCALCITON 0.17    Lab Results  Component Value Date   SARSCOV2NAA POSITIVE (A) 01/19/2020     Transaminitis: Mild-likely second to COVID-19-stable for follow-up.  ABG: No results found for: PHART, PCO2ART, PO2ART, HCO3, TCO2, ACIDBASEDEF, O2SAT  Vent Settings: N/A  Condition - Stable  Family  Communication  :  Spouse updated over the phone ([phone]) on 5/19  Code Status :  Full Code  Diet :  Diet Order            Diet regular Room service appropriate? Yes; Fluid consistency: Thin  Diet effective now               Disposition Plan  :   Status is: Inpatient  Remains inpatient appropriate because:Inpatient level of care appropriate due to severity of illness  Dispo: The patient is from: Home              Anticipated d/c is to: Home              Anticipated d/c date is: 2 days              Patient currently is not medically stable to d/c.  Barriers to discharge: Hypoxia requiring O2 supplementation/complete 5 days of IV Remdesivir  Antimicorbials  :    Anti-infectives (From admission, onward)   Start     Dose/Rate Route Frequency Ordered Stop   01/20/20 1000  remdesivir 100 mg in sodium chloride 0.9 % 100 mL IVPB     100 mg 200 mL/hr over 30 Minutes Intravenous Daily 01/19/20 1004 01/24/20 0959   01/19/20 1100  remdesivir 200 mg in sodium chloride 0.9% 250 mL IVPB     200 mg 580 mL/hr over 30 Minutes Intravenous Once 01/19/20 1004 01/19/20 1258      Inpatient Medications  Scheduled Meds: . dexamethasone (DECADRON) injection  6 mg Intravenous Q24H  . enoxaparin (LOVENOX) injection  40 mg Subcutaneous Q24H  . sodium chloride flush  3 mL Intravenous Q12H   Continuous Infusions: . sodium chloride    . remdesivir 100 mg in NS 100 mL 100 mg (01/21/20 0843)   PRN Meds:.sodium chloride, acetaminophen, albuterol,  bisacodyl, chlorpheniramine-HYDROcodone, guaiFENesin-dextromethorphan, ondansetron **OR** ondansetron (ZOFRAN) IV, oxyCODONE, polyethylene glycol, sodium chloride flush, sodium phosphate   Time Spent in minutes  25  See all Orders from today for further details   Oren Binet M.D on 01/21/2020 at 1:14 PM  To page go to [email or web address] - use universal password  [facility] -  Office  [phone]    Objective:   Vitals:   01/21/20 0012 01/21/20 0335 01/21/20 0700 01/21/20 1100  BP:  (!) 134/91 (!) 142/85 131/85  Pulse: 62 60 66 71  Resp: (!) 23 20 19 20   Temp:  98.3 F (36.8 C) 98.6 F (37 C) 98.5 F (36.9 C)  TempSrc:  Oral Oral Oral  SpO2: 98% 100% 100% 100%  Weight:      Height:        Wt Readings from Last 3 Encounters:  01/20/20 95 kg  04/11/14 83.9 kg  04/03/13 83 kg     Intake/Output Summary (Last 24 hours) at 01/21/2020 1314 Last data filed at 01/21/2020 1252 Gross per 24 hour  Intake 443 ml  Output 1625 ml  Net -1182 ml     Physical Exam Gen Exam:Alert awake-not in any distress HEENT:atraumatic, normocephalic Chest: B/L clear to auscultation anteriorly CVS:S1S2 regular Abdomen:soft non tender, non distended Extremities:no edema Neurology: Non focal Skin: no rash   Data Review:    CBC Recent Labs  Lab 01/19/20 0700 01/20/20 0646 01/21/20 0539  WBC 4.1 3.6* 5.4  HGB 17.3* 15.6 16.1  HCT 53.2* 48.0 49.2  PLT 168 182 219  MCV 89.1 88.6 87.9  MCH 29.0 28.8 28.8  MCHC 32.5  32.5 32.7  RDW 12.3 12.4 12.3  LYMPHSABS 0.6* 0.8 1.0  MONOABS 0.4 0.5 0.5  EOSABS 0.0 0.0 0.0  BASOSABS 0.0 0.0 0.0    Chemistries  Recent Labs  Lab 01/19/20 0700 01/20/20 0646 01/21/20 0539  NA 138 139 139  K 4.0 4.2 3.7  CL 99 101 102  CO2 27 27 28   GLUCOSE 150* 128* 148*  BUN 9 10 13   CREATININE 1.27* 0.97 0.91  CALCIUM 8.8* 8.4* 8.4*  MG  --  2.3  --   AST 50* 46* 35  ALT 46* 47* 42  ALKPHOS 49 42 39  BILITOT 0.4 0.7 0.5    ------------------------------------------------------------------------------------------------------------------ Recent Labs    01/19/20 0701  TRIG 85    No results found for: HGBA1C ------------------------------------------------------------------------------------------------------------------ No results for input(s): TSH, T4TOTAL, T3FREE, THYROIDAB in the last 72 hours.  Invalid input(s): FREET3 ------------------------------------------------------------------------------------------------------------------ Recent Labs    01/20/20 0646 01/21/20 0539  FERRITIN 682* 666*    Coagulation profile No results for input(s): INR, PROTIME in the last 168 hours.  Recent Labs    01/20/20 0646 01/21/20 0539  DDIMER 0.38 0.46    Cardiac Enzymes No results for input(s): CKMB, TROPONINI, MYOGLOBIN in the last 168 hours.  Invalid input(s): CK ------------------------------------------------------------------------------------------------------------------ No results found for: BNP  Micro Results Recent Results (from the past 240 hour(s))  Blood Culture (routine x 2)     Status: None (Preliminary result)   Collection Time: 01/19/20  6:38 AM   Specimen: BLOOD  Result Value Ref Range Status   Specimen Description BLOOD RIGHT ANTECUBITAL  Final   Special Requests   Final    BOTTLES DRAWN AEROBIC AND ANAEROBIC Blood Culture results may not be optimal due to an inadequate volume of blood received in culture bottles   Culture   Final    NO GROWTH 2 DAYS Performed at [facility], [address], [place], [address] [place]    Report Status PENDING  Incomplete  Blood Culture (routine x 2)     Status: None (Preliminary result)   Collection Time: 01/19/20  6:47 AM   Specimen: BLOOD  Result Value Ref Range Status   Specimen Description BLOOD LEFT WRIST  Final   Special Requests   Final    BOTTLES DRAWN AEROBIC AND ANAEROBIC Blood Culture results may not be optimal due to  an inadequate volume of blood received in culture bottles   Culture   Final    NO GROWTH 2 DAYS Performed at [facility], [address], [place], [address] [place]    Report Status PENDING  Incomplete  SARS Coronavirus 2 by RT PCR (hospital order, performed in [facility] hospital lab) Nasopharyngeal Nasopharyngeal Swab     Status: Abnormal   Collection Time: 01/19/20  6:51 AM   Specimen: Nasopharyngeal Swab  Result Value Ref Range Status   SARS Coronavirus 2 POSITIVE (A) NEGATIVE Final    Comment: RESULT CALLED TO, READ BACK BY AND VERIFIED WITH: [facility] RN 8:40 01/19/20 ([email or web address]) (NOTE) SARS-CoV-2 target nucleic acids are DETECTED SARS-CoV-2 RNA is generally detectable in upper respiratory specimens  during the acute phase of infection.  Positive results are indicative  of the presence of the identified virus, but do not rule out bacterial infection or co-infection with other pathogens not detected by the test.  Clinical correlation with patient history and  other diagnostic information is necessary to determine patient infection status.  The expected result is negative. Fact Sheet for  Patients:   BoilerBrush.com.cy  Fact Sheet for Healthcare Providers:   https://pope.com/   This test is not yet approved or cleared by the [place] FDA and  has been authorized for detection and/or diagnosis of SARS-CoV-2 by FDA under an Emergency Use Authorization (EUA).  This EUA will remain in effect (meaning this test can  be used) for the duration of  the COVID-19 declaration under Section 564(b)(1) of the Act, 21 U.S.C. section 360-bbb-3(b)(1), unless the authorization is terminated or revoked sooner. Performed at [facility], [address] [place], [place] [address]     Radiology Reports DG Chest [place] 1 View  Result Date: 01/19/2020 CLINICAL DATA:  COVID pneumonia. Shortness of breath EXAM: PORTABLE CHEST 1 VIEW  COMPARISON:  None. FINDINGS: The cardiac silhouette, mediastinal and hilar contours are within normal limits. Low lung volumes. Patchy, hazy bilateral infiltrates consistent with COVID pneumonia. No pleural effusions. No pneumothorax. The bony thorax is intact. IMPRESSION: Bilateral infiltrates consistent with COVID pneumonia. Electronically Signed   By: Rudie Meyer M.D.   On: 01/19/2020 06:33

## 2020-01-21 NOTE — Progress Notes (Signed)
Adjusting lovenox from 30mg >>40mg  SQ qday. Scr wnl, wt 95kg.   , PharmD, BCIDP, AAHIVP, CPP Infectious Disease Pharmacist 01/21/2020 9:43 AM

## 2020-01-21 NOTE — Plan of Care (Signed)
  Problem: Respiratory: Goal: Will maintain a patent airway Outcome: Progressing Goal: Complications related to the disease process, condition or treatment will be avoided or minimized Outcome: Progressing   

## 2020-01-22 DIAGNOSIS — J1282 Pneumonia due to coronavirus disease 2019: Secondary | ICD-10-CM

## 2020-01-22 LAB — COMPREHENSIVE METABOLIC PANEL
ALT: 47 U/L — ABNORMAL HIGH (ref 0–44)
AST: 33 U/L (ref 15–41)
Albumin: 3.1 g/dL — ABNORMAL LOW (ref 3.5–5.0)
Alkaline Phosphatase: 43 U/L (ref 38–126)
Anion gap: 9 (ref 5–15)
BUN: 12 mg/dL (ref 6–20)
CO2: 28 mmol/L (ref 22–32)
Calcium: 8.5 mg/dL — ABNORMAL LOW (ref 8.9–10.3)
Chloride: 103 mmol/L (ref 98–111)
Creatinine, Ser: 0.92 mg/dL (ref 0.61–1.24)
GFR calc Af Amer: >60 mL/min (ref >60)
GFR calc non Af Amer: >60 mL/min (ref >60)
Glucose, Bld: 145 mg/dL — ABNORMAL HIGH (ref 70–99)
Potassium: 3.7 mmol/L (ref 3.5–5.1)
Sodium: 140 mmol/L (ref 135–145)
Total Bilirubin: 0.8 mg/dL (ref 0.3–1.2)
Total Protein: 6.8 g/dL (ref 6.5–8.1)

## 2020-01-22 LAB — CBC WITH DIFFERENTIAL/PLATELET
Abs Immature Granulocytes: 0.03 10*3/uL (ref 0.00–0.07)
Basophils Absolute: 0 10*3/uL (ref 0.0–0.1)
Basophils Relative: 0 %
Eosinophils Absolute: 0 10*3/uL (ref 0.0–0.5)
Eosinophils Relative: 0 %
HCT: 48.8 % (ref 39.0–52.0)
Hemoglobin: 16 g/dL (ref 13.0–17.0)
Immature Granulocytes: 1 %
Lymphocytes Relative: 19 %
Lymphs Abs: 1.2 10*3/uL (ref 0.7–4.0)
MCH: 28.7 pg (ref 26.0–34.0)
MCHC: 32.8 g/dL (ref 30.0–36.0)
MCV: 87.5 fL (ref 80.0–100.0)
Monocytes Absolute: 0.6 10*3/uL (ref 0.1–1.0)
Monocytes Relative: 10 %
Neutro Abs: 4.3 10*3/uL (ref 1.7–7.7)
Neutrophils Relative %: 70 %
Platelets: 254 10*3/uL (ref 150–400)
RBC: 5.58 MIL/uL (ref 4.22–5.81)
RDW: 12 % (ref 11.5–15.5)
WBC: 6.2 10*3/uL (ref 4.0–10.5)
nRBC: 0 % (ref 0.0–0.2)

## 2020-01-22 LAB — C-REACTIVE PROTEIN: CRP: 1.2 mg/dL — ABNORMAL HIGH (ref <1.0)

## 2020-01-22 LAB — FERRITIN: Ferritin: 557 ng/mL — ABNORMAL HIGH (ref 24–336)

## 2020-01-22 LAB — D-DIMER, QUANTITATIVE: D-Dimer, Quant: 0.41 ug/mL-FEU (ref 0.00–0.50)

## 2020-01-22 MED ORDER — ALBUTEROL SULFATE HFA 108 (90 BASE) MCG/ACT IN AERS: 2.0000 | INHALATION_SPRAY | RESPIRATORY_TRACT | 0 refills | Status: AC | PRN

## 2020-01-22 MED ORDER — DEXAMETHASONE 6 MG PO TABS
6.0000 mg | ORAL_TABLET | Freq: Every day | ORAL | 0 refills | Status: DC
Start: 2020-01-22 — End: 2020-02-25

## 2020-01-22 MED ORDER — BENZONATATE 100 MG PO CAPS
100.0000 mg | ORAL_CAPSULE | Freq: Four times a day (QID) | ORAL | 0 refills | Status: DC | PRN
Start: 2020-01-22 — End: 2020-02-25

## 2020-01-22 NOTE — Plan of Care (Signed)
  Problem: Respiratory: Goal: Will maintain a patent airway Outcome: Progressing Goal: Complications related to the disease process, condition or treatment will be avoided or minimized Outcome: Progressing   

## 2020-01-22 NOTE — Progress Notes (Signed)
Pt D/C to home with family. Pt education, D/C paperwork, medication and IV removal completed prior to this shift.   Pt stable on RA upon D/C. Oxygen tank and personal belongings taken with pt.  Pt had no questions or concerns.  Pt transportrf by wheelchair.  Joseantonio Dittmar, RN 1940 

## 2020-01-22 NOTE — TOC Transition Note (Addendum)
Transition of Care [facility]) - CM/SW Discharge Note   Patient Details  Name: Ricky Keith MRN: [id number] Date of Birth: 1981-02-23  Transition of [facility]) CM/SW Contact:  Cherylann Parr, RN Phone Number: 01/22/2020, 10:13 AM   Clinical Narrative:    Pt confirms he is independent from home with wife.  Pt confirms he is uninsured and does not have a PCP.  Pt in agreement for CM to arrange PCP visit - see AVS.  CM received verbal consult for covid at home program - pt in agreement - referral sent via email.  Both address and phone number verified to be correct in Epic.  Pt denied barriers with [employer] including transportation.  Pt in agreement with oxygen as ordered - CM made referral to [employer] for charity oxygen.  Pt will need to remain in house until home oxygen is confirmed to be delivered.    CM dicussed approximate cost of discharge meds per good rx with pt.  Pt states he can pay cost.  CM printed coupons for both Albuterol and decadron to nurse station - bedside nurse to give to pt prior to discharge   Final next level of care: Home/Self Care     Patient Goals and CMS Choice        Discharge Placement                       Discharge Plan and Services                DME Arranged: Oxygen DME Agency: [employer] Date DME Agency Contacted: 01/22/20 Time DME Agency Contacted: 1013 Representative spoke with at DME Agency: Zack            Social Determinants of Health (SDOH) Interventions     Readmission Risk Interventions No flowsheet data found.

## 2020-01-22 NOTE — Discharge Summary (Addendum)
PATIENT DETAILS Name: Ricky Keith Age: 39 y.o. Sex: male Date of Birth: Aug 23, 1981 MRN: [id number]. Admitting Physician: Jonah Blue, MD YOM:AYOKHTX, No Pcp Per  Admit Date: 01/19/2020 Discharge date: 01/22/2020  Recommendations for Outpatient Follow-up:  1. Follow up with PCP in 1-2 weeks 2. Please obtain CMP/CBC in one week 3. Repeat Chest Xray in 4-6 week 4. Please reassess if patient requires home O2 at next visit.  Admitted From:  Home  Disposition: Home    Home Health: No  Equipment/Devices: O2 2 L with ambulation  Discharge Condition: Stable  CODE STATUS: FULL CODE  Diet recommendation:  Diet Order            Diet general        Diet regular Room service appropriate? Yes; Fluid consistency: Thin  Diet effective now               Brief Narrative: Patient is a 39 y.o. male with no significant past medical history-presenting with acute hypoxic respiratory failure secondary to COVID-19 pneumonia.    Per history-patient tested positive on 5/10-after exposure to his 45 year old son.  Significant Events: 5/18: Admit to [facility] for hypoxia secondary to Covid pneumonia  COVID-19 medications: Steroids: 5/18>> Remdesivir: 5/18>>  Antibiotics: None  Microbiology data: 5/18: Blood cultures>> no growth  Significant studies: 5/18: Chest x-ray>> bilateral infiltrates consistent with COVID-19  Procedures: None  Consults None  Brief Hospital Course: Acute Hypoxic Resp Failure due to Covid 19 Viral pneumonia:  Continues to improve-no longer hypoxic at rest-CRP has trended down.  Although improved-requires around 2 L of oxygen to maintain O2 saturation with ambulation.  Plan is to discharge home with O2-we will complete remdesivir on 5/22 at the infusion center at [facility], will continue with steroids for a few more days.   COVID-19 Labs:  Recent Labs    01/20/20 0646 01/21/20 0539 01/22/20 0357  DDIMER 0.38 0.46 0.41  FERRITIN 682* 666*  557*  CRP 6.4* 3.1* 1.2*    Lab Results  Component Value Date   SARSCOV2NAA POSITIVE (A) 01/19/2020      Discharge Diagnoses:  Principal Problem:   Acute hypoxemic respiratory failure due to COVID-19 [facility])   Discharge Instructions:    Person Under Monitoring Name: Estanislao Harmon  Location: [address] [place] [address]   Infection Prevention Recommendations for Individuals Confirmed to have, or Being Evaluated for, 2019 Novel Coronavirus (COVID-19) Infection Who Receive Care at Home  Individuals who are confirmed to have, or are being evaluated for, COVID-19 should follow the prevention steps below until a healthcare provider or local or state health department says they can return to normal activities.  Stay home except to get medical care You should restrict activities outside your home, except for getting medical care. Do not go to work, school, or public areas, and do not use public transportation or taxis.  Call ahead before visiting your doctor Before your medical appointment, call the healthcare provider and tell them that you have, or are being evaluated for, COVID-19 infection. This will help the healthcare provider's office take steps to keep other people from getting infected. Ask your healthcare provider to call the local or state health department.  Monitor your symptoms Seek prompt medical attention if your illness is worsening (e.g., difficulty breathing). Before going to your medical appointment, call the healthcare provider and tell them that you have, or are being evaluated for, COVID-19 infection. Ask your healthcare provider to call the local or state  health department.  Wear a facemask You should wear a facemask that covers your nose and mouth when you are in the same room with other people and when you visit a healthcare provider. People who live with or visit you should also wear a facemask while they are in the same room with  you.  Separate yourself from other people in your home As much as possible, you should stay in a different room from other people in your home. Also, you should use a separate bathroom, if available.  Avoid sharing household items You should not share dishes, drinking glasses, cups, eating utensils, towels, bedding, or other items with other people in your home. After using these items, you should wash them thoroughly with soap and water.  Cover your coughs and sneezes Cover your mouth and nose with a tissue when you cough or sneeze, or you can cough or sneeze into your sleeve. Throw used tissues in a lined trash can, and immediately wash your hands with soap and water for at least 20 seconds or use an alcohol-based hand rub.  Wash your [employer] your hands often and thoroughly with soap and water for at least 20 seconds. You can use an alcohol-based hand sanitizer if soap and water are not available and if your hands are not visibly dirty. Avoid touching your eyes, nose, and mouth with unwashed hands.   Prevention Steps for Caregivers and Household Members of Individuals Confirmed to have, or Being Evaluated for, COVID-19 Infection Being Cared for in the Home  If you live with, or provide care at home for, a person confirmed to have, or being evaluated for, COVID-19 infection please follow these guidelines to prevent infection:  Follow healthcare provider's instructions Make sure that you understand and can help the patient follow any healthcare provider instructions for all care.  Provide for the patient's basic needs You should help the patient with basic needs in the home and provide support for getting groceries, prescriptions, and other personal needs.  Monitor the patient's symptoms If they are getting sicker, call his or her medical provider and tell them that the patient has, or is being evaluated for, COVID-19 infection. This will help the healthcare provider's office  take steps to keep other people from getting infected. Ask the healthcare provider to call the local or state health department.  Limit the number of people who have contact with the patient  If possible, have only one caregiver for the patient.  Other household members should stay in another home or place of residence. If this is not possible, they should stay  in another room, or be separated from the patient as much as possible. Use a separate bathroom, if available.  Restrict visitors who do not have an essential need to be in the home.  Keep older adults, very young children, and other sick people away from the patient Keep older adults, very young children, and those who have compromised immune systems or chronic health conditions away from the patient. This includes people with chronic heart, lung, or kidney conditions, diabetes, and cancer.  Ensure good ventilation Make sure that shared spaces in the home have good air flow, such as from an air conditioner or an opened window, weather permitting.  Wash your hands often  Wash your hands often and thoroughly with soap and water for at least 20 seconds. You can use an alcohol based hand sanitizer if soap and water are not available and if your hands are not  visibly dirty.  Avoid touching your eyes, nose, and mouth with unwashed hands.  Use disposable paper towels to dry your hands. If not available, use dedicated cloth towels and replace them when they become wet.  Wear a facemask and gloves  Wear a disposable facemask at all times in the room and gloves when you touch or have contact with the patient's blood, body fluids, and/or secretions or excretions, such as sweat, saliva, sputum, nasal mucus, vomit, urine, or feces.  Ensure the mask fits over your nose and mouth tightly, and do not touch it during use.  Throw out disposable facemasks and gloves after using them. Do not reuse.  Wash your hands immediately after removing  your facemask and gloves.  If your personal clothing becomes contaminated, carefully remove clothing and launder. Wash your hands after handling contaminated clothing.  Place all used disposable facemasks, gloves, and other waste in a lined container before disposing them with other household waste.  Remove gloves and wash your hands immediately after handling these items.  Do not share dishes, glasses, or other household items with the patient  Avoid sharing household items. You should not share dishes, drinking glasses, cups, eating utensils, towels, bedding, or other items with a patient who is confirmed to have, or being evaluated for, COVID-19 infection.  After the person uses these items, you should wash them thoroughly with soap and water.  Wash laundry thoroughly  Immediately remove and wash clothes or bedding that have blood, body fluids, and/or secretions or excretions, such as sweat, saliva, sputum, nasal mucus, vomit, urine, or feces, on them.  Wear gloves when handling laundry from the patient.  Read and follow directions on labels of laundry or clothing items and detergent. In general, wash and dry with the warmest temperatures recommended on the label.  Clean all areas the individual has used often  Clean all touchable surfaces, such as counters, tabletops, doorknobs, bathroom fixtures, toilets, phones, keyboards, tablets, and bedside tables, every day. Also, clean any surfaces that may have blood, body fluids, and/or secretions or excretions on them.  Wear gloves when cleaning surfaces the patient has come in contact with.  Use a diluted bleach solution (e.g., dilute bleach with 1 part bleach and 10 parts water) or a household disinfectant with a label that says EPA-registered for coronaviruses. To make a bleach solution at home, add 1 tablespoon of bleach to 1 quart (4 cups) of water. For a larger supply, add  cup of bleach to 1 gallon (16 cups) of water.  Read labels  of cleaning products and follow recommendations provided on product labels. Labels contain instructions for safe and effective use of the cleaning product including precautions you should take when applying the product, such as wearing gloves or eye protection and making sure you have good ventilation during use of the product.  Remove gloves and wash hands immediately after cleaning.  Monitor yourself for signs and symptoms of illness Caregivers and household members are considered close contacts, should monitor their health, and will be asked to limit movement outside of the home to the extent possible. Follow the monitoring steps for close contacts listed on the symptom monitoring form.   ? If you have additional questions, contact your local health department or call the epidemiologist on call at [phone] (available 24/7). ? This guidance is subject to change. For the most up-to-date guidance from [facility], please refer to their website: TripMetro.hu    Activity:  As tolerated   Discharge Instructions  Call MD for:  difficulty breathing, headache or visual disturbances   Complete by: As directed    Diet general   Complete by: As directed    Discharge instructions   Complete by: As directed    1)You are scheduled for an outpatient infusion of Remdesivir at 10:00  AM on Saturday 5/22.  Please report to [facility] at [address].  Drive to the security guard and tell them you are here for an infusion. They will direct you to the front entrance where we will come and get you.  For questions call [phone].  Thanks   2) 3 weeks of isolation from the day of your first positive Covid test.   Follow with Primary MD  in 1-2 weeks  Please get a complete blood count and chemistry panel checked by your Primary MD at your next visit, and again as instructed by your Primary MD.  Get Medicines reviewed and  adjusted: Please take all your medications with you for your next visit with your Primary MD  Laboratory/radiological data: Please request your Primary MD to go over all hospital tests and procedure/radiological results at the follow up, please ask your Primary MD to get all Hospital records sent to his/her office.  In some cases, they will be blood work, cultures and biopsy results pending at the time of your discharge. Please request that your primary care M.D. follows up on these results.  Also Note the following: If you experience worsening of your admission symptoms, develop shortness of breath, life threatening emergency, suicidal or homicidal thoughts you must seek medical attention immediately by calling 911 or calling your MD immediately  if symptoms less severe.  You must read complete instructions/literature along with all the possible adverse reactions/side effects for all the Medicines you take and that have been prescribed to you. Take any new Medicines after you have completely understood and accpet all the possible adverse reactions/side effects.   Do not drive when taking Pain medications or sleeping medications (Benzodaizepines)  Do not take more than prescribed Pain, Sleep and Anxiety Medications. It is not advisable to combine anxiety,sleep and pain medications without talking with your primary care practitioner  Special Instructions: If you have smoked or chewed Tobacco  in the last 2 yrs please stop smoking, stop any regular Alcohol  and or any Recreational drug use.  Wear Seat belts while driving.  Please note: You were cared for by a hospitalist during your hospital stay. Once you are discharged, your primary care physician will handle any further medical issues. Please note that NO REFILLS for any discharge medications will be authorized once you are discharged, as it is imperative that you return to your primary care physician (or establish a relationship with a  primary care physician if you do not have one) for your post hospital discharge needs so that they can reassess your need for medications and monitor your lab values.   Increase activity slowly   Complete by: As directed      Allergies as of 01/22/2020   No Known Allergies     Medication List    STOP taking these medications   MUCINEX SINUS-MAX DAY/NIGHT PO     TAKE these medications   albuterol 108 (90 Base) MCG/ACT inhaler Commonly known as: VENTOLIN HFA Inhale 2 puffs into the lungs every 2 (two) hours as needed for wheezing or shortness of breath.   benzonatate 100 MG capsule Commonly known as: LawyerTessalon Perles Take 1  capsule (100 mg total) by mouth every 6 (six) hours as needed for cough.   dexamethasone 6 MG tablet Commonly known as: DECADRON Take 1 tablet (6 mg total) by mouth daily.            Durable Medical Equipment  (From admission, onward)         Start     Ordered   01/22/20 0847  For home use only DME oxygen  Once    Question Answer Comment  Length of Need 6 Months   Mode or (Route) Nasal cannula   Liters per Minute 2   Frequency Continuous (stationary and portable oxygen unit needed)   Oxygen conserving device Yes   Oxygen delivery system Gas      01/22/20 0847         Follow-up Information    Primary Care MD. Schedule an appointment as soon as possible for a visit in 1 week(s).          No Known Allergies  Other Procedures/Studies: DG Chest Port 1 View  Result Date: 01/19/2020 CLINICAL DATA:  COVID pneumonia. Shortness of breath EXAM: PORTABLE CHEST 1 VIEW COMPARISON:  None. FINDINGS: The cardiac silhouette, mediastinal and hilar contours are within normal limits. Low lung volumes. Patchy, hazy bilateral infiltrates consistent with COVID pneumonia. No pleural effusions. No pneumothorax. The bony thorax is intact. IMPRESSION: Bilateral infiltrates consistent with COVID pneumonia. Electronically Signed   By: Marijo Sanes M.D.   On:  01/19/2020 06:33     TODAY-DAY OF DISCHARGE:  Subjective:   Celedonio Savage today has no headache,no chest abdominal pain,no new weakness tingling or numbness, feels much better wants to go home today.   Objective:   Blood pressure 130/79, pulse 87, temperature 98 F (36.7 C), temperature source Oral, resp. rate (!) 21, height 6\' 1"  (1.854 m), weight 95 kg, SpO2 90 %.  Intake/Output Summary (Last 24 hours) at 01/22/2020 1049 Last data filed at 01/22/2020 0829 Gross per 24 hour  Intake 343 ml  Output 1300 ml  Net -957 ml   Filed Weights   01/20/20 1844  Weight: 95 kg    Exam: Awake Alert, Oriented *3, No new F.N deficits, Normal affect [place].AT,PERRAL Supple Neck,No JVD, No cervical lymphadenopathy appriciated.  Symmetrical Chest wall movement, Good air movement bilaterally, CTAB RRR,No Gallops,Rubs or new Murmurs, No Parasternal Heave +ve B.Sounds, Abd Soft, Non tender, No organomegaly appriciated, No rebound -guarding or rigidity. No Cyanosis, Clubbing or edema, No new Rash or bruise   PERTINENT RADIOLOGIC STUDIES: DG Chest Port 1 View  Result Date: 01/19/2020 CLINICAL DATA:  COVID pneumonia. Shortness of breath EXAM: PORTABLE CHEST 1 VIEW COMPARISON:  None. FINDINGS: The cardiac silhouette, mediastinal and hilar contours are within normal limits. Low lung volumes. Patchy, hazy bilateral infiltrates consistent with COVID pneumonia. No pleural effusions. No pneumothorax. The bony thorax is intact. IMPRESSION: Bilateral infiltrates consistent with COVID pneumonia. Electronically Signed   By: Marijo Sanes M.D.   On: 01/19/2020 06:33     PERTINENT LAB RESULTS: CBC: Recent Labs    01/21/20 0539 01/22/20 0357  WBC 5.4 6.2  HGB 16.1 16.0  HCT 49.2 48.8  PLT 219 254   CMET CMP     Component Value Date/Time   NA 140 01/22/2020 0357   K 3.7 01/22/2020 0357   CL 103 01/22/2020 0357   CO2 28 01/22/2020 0357   GLUCOSE 145 (H) 01/22/2020 0357   BUN 12 01/22/2020 0357    CREATININE 0.92 01/22/2020  0357   CALCIUM 8.5 (L) 01/22/2020 0357   PROT 6.8 01/22/2020 0357   ALBUMIN 3.1 (L) 01/22/2020 0357   AST 33 01/22/2020 0357   ALT 47 (H) 01/22/2020 0357   ALKPHOS 43 01/22/2020 0357   BILITOT 0.8 01/22/2020 0357   GFRNONAA >60 01/22/2020 0357   GFRAA >60 01/22/2020 0357    GFR Estimated Creatinine Clearance: 123 mL/min (by C-G formula based on SCr of 0.92 mg/dL). No results for input(s): LIPASE, AMYLASE in the last 72 hours. No results for input(s): CKTOTAL, CKMB, CKMBINDEX, TROPONINI in the last 72 hours. Invalid input(s): POCBNP Recent Labs    01/21/20 0539 01/22/20 0357  DDIMER 0.46 0.41   No results for input(s): HGBA1C in the last 72 hours. No results for input(s): CHOL, HDL, LDLCALC, TRIG, CHOLHDL, LDLDIRECT in the last 72 hours. No results for input(s): TSH, T4TOTAL, T3FREE, THYROIDAB in the last 72 hours.  Invalid input(s): FREET3 Recent Labs    01/21/20 0539 01/22/20 0357  FERRITIN 666* 557*   Coags: No results for input(s): INR in the last 72 hours.  Invalid input(s): PT Microbiology: Recent Results (from the past 240 hour(s))  Blood Culture (routine x 2)     Status: None (Preliminary result)   Collection Time: 01/19/20  6:38 AM   Specimen: BLOOD  Result Value Ref Range Status   Specimen Description BLOOD RIGHT ANTECUBITAL  Final   Special Requests   Final    BOTTLES DRAWN AEROBIC AND ANAEROBIC Blood Culture results may not be optimal due to an inadequate volume of blood received in culture bottles   Culture   Final    NO GROWTH 3 DAYS Performed at [facility], [address], [place], [place] [address]    Report Status PENDING  Incomplete  Blood Culture (routine x 2)     Status: None (Preliminary result)   Collection Time: 01/19/20  6:47 AM   Specimen: BLOOD  Result Value Ref Range Status   Specimen Description BLOOD LEFT WRIST  Final   Special Requests   Final    BOTTLES DRAWN AEROBIC AND ANAEROBIC Blood Culture  results may not be optimal due to an inadequate volume of blood received in culture bottles   Culture   Final    NO GROWTH 3 DAYS Performed at [facility], [address], [place], [place] [address]    Report Status PENDING  Incomplete  SARS Coronavirus 2 by RT PCR (hospital order, performed in [facility] hospital lab) Nasopharyngeal Nasopharyngeal Swab     Status: Abnormal   Collection Time: 01/19/20  6:51 AM   Specimen: Nasopharyngeal Swab  Result Value Ref Range Status   SARS Coronavirus 2 POSITIVE (A) NEGATIVE Final    Comment: RESULT CALLED TO, READ BACK BY AND VERIFIED WITH: Donnetta Hail RN 8:40 01/19/20 ([email or web address]) (NOTE) SARS-CoV-2 target nucleic acids are DETECTED SARS-CoV-2 RNA is generally detectable in upper respiratory specimens  during the acute phase of infection.  Positive results are indicative  of the presence of the identified virus, but do not rule out bacterial infection or co-infection with other pathogens not detected by the test.  Clinical correlation with patient history and  other diagnostic information is necessary to determine patient infection status.  The expected result is negative. Fact Sheet for Patients:   BoilerBrush.com.cy  Fact Sheet for Healthcare Providers:   https://pope.com/   This test is not yet approved or cleared by the [place] FDA and  has been authorized for  detection and/or diagnosis of SARS-CoV-2 by FDA under an Emergency Use Authorization (EUA).  This EUA will remain in effect (meaning this test can  be used) for the duration of  the COVID-19 declaration under Section 564(b)(1) of the Act, 21 U.S.C. section 360-bbb-3(b)(1), unless the authorization is terminated or revoked sooner. Performed at [facility], [address] [place], [place] [address]     FURTHER DISCHARGE INSTRUCTIONS:  Get Medicines reviewed and adjusted: Please take all your medications with you for  your next visit with your Primary MD  Laboratory/radiological data: Please request your Primary MD to go over all hospital tests and procedure/radiological results at the follow up, please ask your Primary MD to get all Hospital records sent to his/her office.  In some cases, they will be blood work, cultures and biopsy results pending at the time of your discharge. Please request that your primary care M.D. goes through all the records of your hospital data and follows up on these results.  Also Note the following: If you experience worsening of your admission symptoms, develop shortness of breath, life threatening emergency, suicidal or homicidal thoughts you must seek medical attention immediately by calling 911 or calling your MD immediately  if symptoms less severe.  You must read complete instructions/literature along with all the possible adverse reactions/side effects for all the Medicines you take and that have been prescribed to you. Take any new Medicines after you have completely understood and accpet all the possible adverse reactions/side effects.   Do not drive when taking Pain medications or sleeping medications (Benzodaizepines)  Do not take more than prescribed Pain, Sleep and Anxiety Medications. It is not advisable to combine anxiety,sleep and pain medications without talking with your primary care practitioner  Special Instructions: If you have smoked or chewed Tobacco  in the last 2 yrs please stop smoking, stop any regular Alcohol  and or any Recreational drug use.  Wear Seat belts while driving.  Please note: You were cared for by a hospitalist during your hospital stay. Once you are discharged, your primary care physician will handle any further medical issues. Please note that NO REFILLS for any discharge medications will be authorized once you are discharged, as it is imperative that you return to your primary care physician (or establish a relationship with a primary  care physician if you do not have one) for your post hospital discharge needs so that they can reassess your need for medications and monitor your lab values.  Total Time spent coordinating discharge including counseling, education and face to face time equals 35 minutes.  SignedJeoffrey Massed 01/22/2020 10:49 AM

## 2020-01-22 NOTE — Progress Notes (Signed)
Patient scheduled for outpatient Remdesivir infusion at 10:00  AM on Saturday 5/22.  Please advise them to report to [facility] at [address].  Drive to the security guard and tell them you are here for an infusion. They will direct you to the front entrance where we will come and get you.  For questions call [phone].  Thanks

## 2020-01-22 NOTE — Progress Notes (Signed)
SATURATION QUALIFICATIONS: (This note is used to comply with regulatory documentation for home oxygen)  Patient Saturations on Room Air at Rest = 92%  Patient Saturations on Room Air while Ambulating = 87%  Patient Saturations on 2 Liters of oxygen while Ambulating = 90%  Please briefly explain why patient needs home oxygen: Pt's oxygen level before ambulation was 92%.  Pt started ambulating and pt's oxygen level maintained at 90% for majority of the walk.  However, pt's oxygen level decreased to 87%.  2 L of oxygen applied via nasal cannula.  Pt's oxygen increased to 90% and remained there for the rest of the walk.  Pt was returned to room and pt took oxygen off.  Pt's oxygen level 90% after ambulation.

## 2020-01-22 NOTE — Discharge Instructions (Addendum)
You are scheduled for an outpatient infusion of Remdesivir at 10:00  AM on Saturday 5/22.  Please [facility] to [facility] at [address]  Drive to the security guard and tell them you are here for an infusion. They will direct you to the front entrance where we will come and get you.  For questions call [phone].  Thanks       Person Under Monitoring Name: Ricky Keith  Location: [place] [address]   Infection Prevention Recommendations for Individuals Confirmed to have, or Being Evaluated for, 2019 Novel Coronavirus (COVID-19) Infection Who Receive Care at Home  Individuals who are confirmed to have, or are being evaluated for, COVID-19 should follow the prevention steps below until a healthcare provider or local or state health department says they can return to normal activities.  Stay home except to get medical care You should restrict activities outside your home, except for getting medical care. Do not go to work, school, or public areas, and do not use public transportation or taxis.  Call ahead before visiting your doctor Before your medical appointment, call the healthcare provider and tell them that you have, or are being evaluated for, COVID-19 infection. This will help the healthcare provider's office take steps to keep other people from getting infected. Ask your healthcare provider to call the local or state health department.  Monitor your symptoms Seek prompt medical attention if your illness is worsening (e.g., difficulty breathing). Before going to your medical appointment, call the healthcare provider and tell them that you have, or are being evaluated for, COVID-19 infection. Ask your healthcare provider to call the local or state health department.  Wear a facemask You should wear a facemask that covers your nose and mouth when you are in the same room with other people and when you visit a healthcare provider.  People who live with or visit you should also wear a facemask while they are in the same room with you.  Separate yourself from other people in your home As much as possible, you should stay in a different room from other people in your home. Also, you should use a separate bathroom, if available.  Avoid sharing household items You should not share dishes, drinking glasses, cups, eating utensils, towels, bedding, or other items with other people in your home. After using these items, you should wash them thoroughly with soap and water.  Cover your coughs and sneezes Cover your mouth and nose with a tissue when you cough or sneeze, or you can cough or sneeze into your sleeve. Throw used tissues in a lined trash can, and immediately wash your hands with soap and water for at least 20 seconds or use an alcohol-based hand rub.  Wash your [facility] your hands often and thoroughly with soap and water for at least 20 seconds. You can use an alcohol-based hand sanitizer if soap and water are not available and if your hands are not visibly dirty. Avoid touching your eyes, nose, and mouth with unwashed hands.   Prevention Steps for Caregivers and Household Members of Individuals Confirmed to have, or Being Evaluated for, COVID-19 Infection Being Cared for in the Home  If you live with, or provide care at home for, a person confirmed to have, or being evaluated for, COVID-19 infection please follow these guidelines to prevent infection:  Follow healthcare provider's instructions Make sure that you understand and can help the patient follow any healthcare  provider instructions for all care.  Provide for the patient's basic needs You should help the patient with basic needs in the home and provide support for getting groceries, prescriptions, and other personal needs.  Monitor the patient's symptoms If they are getting sicker, call his or her medical provider and tell them that the patient  has, or is being evaluated for, COVID-19 infection. This will help the healthcare provider's office take steps to keep other people from getting infected. Ask the healthcare provider to call the local or state health department.  Limit the number of people who have contact with the patient  If possible, have only one caregiver for the patient.  Other household members should stay in another home or place of residence. If this is not possible, they should stay  in another room, or be separated from the patient as much as possible. Use a separate bathroom, if available.  Restrict visitors who do not have an essential need to be in the home.  Keep older adults, very young children, and other sick people away from the patient Keep older adults, very young children, and those who have compromised immune systems or chronic health conditions away from the patient. This includes people with chronic heart, lung, or kidney conditions, diabetes, and cancer.  Ensure good ventilation Make sure that shared spaces in the home have good air flow, such as from an air conditioner or an opened window, weather permitting.  Wash your hands often  Wash your hands often and thoroughly with soap and water for at least 20 seconds. You can use an alcohol based hand sanitizer if soap and water are not available and if your hands are not visibly dirty.  Avoid touching your eyes, nose, and mouth with unwashed hands.  Use disposable paper towels to dry your hands. If not available, use dedicated cloth towels and replace them when they become wet.  Wear a facemask and gloves  Wear a disposable facemask at all times in the room and gloves when you touch or have contact with the patient's blood, body fluids, and/or secretions or excretions, such as sweat, saliva, sputum, nasal mucus, vomit, urine, or feces.  Ensure the mask fits over your nose and mouth tightly, and do not touch it during use.  Throw out disposable  facemasks and gloves after using them. Do not reuse.  Wash your hands immediately after removing your facemask and gloves.  If your personal clothing becomes contaminated, carefully remove clothing and launder. Wash your hands after handling contaminated clothing.  Place all used disposable facemasks, gloves, and other waste in a lined container before disposing them with other household waste.  Remove gloves and wash your hands immediately after handling these items.  Do not share dishes, glasses, or other household items with the patient  Avoid sharing household items. You should not share dishes, drinking glasses, cups, eating utensils, towels, bedding, or other items with a patient who is confirmed to have, or being evaluated for, COVID-19 infection.  After the person uses these items, you should wash them thoroughly with soap and water.  Wash laundry thoroughly  Immediately remove and wash clothes or bedding that have blood, body fluids, and/or secretions or excretions, such as sweat, saliva, sputum, nasal mucus, vomit, urine, or feces, on them.  Wear gloves when handling laundry from the patient.  Read and follow directions on labels of laundry or clothing items and detergent. In general, wash and dry with the warmest temperatures recommended on the label.  Clean all areas the individual has used often  Clean all touchable surfaces, such as counters, tabletops, doorknobs, bathroom fixtures, toilets, phones, keyboards, tablets, and bedside tables, every day. Also, clean any surfaces that may have blood, body fluids, and/or secretions or excretions on them.  Wear gloves when cleaning surfaces the patient has come in contact with.  Use a diluted bleach solution (e.g., dilute bleach with 1 part bleach and 10 parts water) or a household disinfectant with a label that says EPA-registered for coronaviruses. To make a bleach solution at home, add 1 tablespoon of bleach to 1 quart (4 cups)  of water. For a larger supply, add  cup of bleach to 1 gallon (16 cups) of water.  Read labels of cleaning products and follow recommendations provided on product labels. Labels contain instructions for safe and effective use of the cleaning product including precautions you should take when applying the product, such as wearing gloves or eye protection and making sure you have good ventilation during use of the product.  Remove gloves and wash hands immediately after cleaning.  Monitor yourself for signs and symptoms of illness Caregivers and household members are considered close contacts, should monitor their health, and will be asked to limit movement outside of the home to the extent possible. Follow the monitoring steps for close contacts listed on the symptom monitoring form.   ? If you have additional questions, contact your local health department or call the epidemiologist on call at [phone] (available 24/7). ? This guidance is subject to change. For the most up-to-date guidance from [facility], please refer to their website: [email or web address]

## 2020-01-23 ENCOUNTER — Ambulatory Visit (HOSPITAL_COMMUNITY)
Admit: 2020-01-23 | Discharge: 2020-01-23 | Disposition: A | Discharge status: Home / Self Care | Payer: HRSA Program | Source: Ambulatory Visit | Attending: Pulmonary Disease | Admitting: Pulmonary Disease

## 2020-01-23 DIAGNOSIS — U071 COVID-19: Secondary | ICD-10-CM | POA: Insufficient documentation

## 2020-01-23 MED ORDER — DIPHENHYDRAMINE HCL 50 MG/ML IJ SOLN: 50.0000 mg | Freq: Once | INTRAMUSCULAR | Status: DC | PRN

## 2020-01-23 MED ORDER — SODIUM CHLORIDE 0.9 % IV SOLN
100.0000 mg | Freq: Once | INTRAVENOUS | Status: AC
  Administered 2020-01-23: 100 mg via INTRAVENOUS
  Filled 2020-01-23: qty 20

## 2020-01-23 MED ORDER — METHYLPREDNISOLONE SODIUM SUCC 125 MG IJ SOLR: 125.0000 mg | Freq: Once | INTRAMUSCULAR | Status: DC | PRN

## 2020-01-23 MED ORDER — EPINEPHRINE 0.3 MG/0.3ML IJ SOAJ: 0.3000 mg | Freq: Once | INTRAMUSCULAR | Status: DC | PRN

## 2020-01-23 MED ORDER — FAMOTIDINE IN NACL 20-0.9 MG/50ML-% IV SOLN: 20.0000 mg | Freq: Once | INTRAVENOUS | Status: DC | PRN

## 2020-01-23 MED ORDER — ALBUTEROL SULFATE HFA 108 (90 BASE) MCG/ACT IN AERS: 2.0000 | INHALATION_SPRAY | Freq: Once | RESPIRATORY_TRACT | Status: DC | PRN

## 2020-01-23 MED ORDER — SODIUM CHLORIDE 0.9 % IV SOLN: INTRAVENOUS | Status: DC | PRN

## 2020-01-23 NOTE — Progress Notes (Addendum)
  Diagnosis: COVID-19  Physician: Dr. Delford Field   Procedure: [facility] Med: remdesivir infusion - Provided patient with remdesivir fact sheet for patients, parents and caregivers prior to infusion.  Complications: No immediate complications noted.  Discharge: Discharged home   Ricky Keith 01/23/2020

## 2020-01-23 NOTE — Discharge Instructions (Signed)

## 2020-01-24 LAB — CULTURE, BLOOD (ROUTINE X 2)
Culture: NO GROWTH
Culture: NO GROWTH

## 2020-02-25 ENCOUNTER — Ambulatory Visit (INDEPENDENT_AMBULATORY_CARE_PROVIDER_SITE_OTHER): Payer: Self-pay | Admitting: Nurse Practitioner

## 2020-02-25 ENCOUNTER — Encounter: Payer: Self-pay | Admitting: Nurse Practitioner

## 2020-02-25 ENCOUNTER — Other Ambulatory Visit: Payer: Self-pay

## 2020-02-25 VITALS — BP 140/89 | HR 71 | Temp 98.0°F | Ht 73.0 in | Wt 213.0 lb

## 2020-02-25 DIAGNOSIS — J9601 Acute respiratory failure with hypoxia: Secondary | ICD-10-CM

## 2020-02-25 DIAGNOSIS — Z8616 Personal history of COVID-19: Secondary | ICD-10-CM

## 2020-02-25 DIAGNOSIS — Z0000 Encounter for general adult medical examination without abnormal findings: Secondary | ICD-10-CM

## 2020-02-25 DIAGNOSIS — U071 COVID-19: Secondary | ICD-10-CM

## 2020-02-25 LAB — POCT URINALYSIS DIPSTICK
Bilirubin, UA: NEGATIVE
Blood, UA: NEGATIVE
Glucose, UA: NEGATIVE
Ketones, UA: NEGATIVE
Leukocytes, UA: NEGATIVE
Nitrite, UA: NEGATIVE
Protein, UA: NEGATIVE
Spec Grav, UA: >=1.03 — AB (ref 1.010–1.025)
Urobilinogen, UA: 0.2 E.U./dL
pH, UA: 5 (ref 5.0–8.0)

## 2020-02-25 LAB — POCT GLYCOSYLATED HEMOGLOBIN (HGB A1C)
HbA1c POC (<> result, manual entry): 6.6 % (ref 4.0–5.6)
HbA1c, POC (controlled diabetic range): 6.6 % (ref 0.0–7.0)
HbA1c, POC (prediabetic range): 6.6 % — AB (ref 5.7–6.4)
Hemoglobin A1C: 6.6 % — AB (ref 4.0–5.6)

## 2020-02-25 LAB — GLUCOSE, POCT (MANUAL RESULT ENTRY): POC Glucose: 141 mg/dl — AB (ref 70–99)

## 2020-02-25 NOTE — Progress Notes (Signed)
[place], [place]  [address] Phone:  [phone]   Fax:  [phone]   New Patient Office Visit  Subjective:  Patient ID: Ricky Keith, male    DOB: 19-Aug-1981  Age: 39 y.o. MRN: [id number]  CC:  Chief Complaint  Patient presents with  . Establish Care    requetsing FMLA forms; needs to be seen by provider before able to go back to work, was in hospital due to [place] 5/18    HPI [name] presents to establish care. He  has no past medical history on file. He was recently hospitalized with pneumonia due to COVID-19 infection.  He had a 3-day hospital course.  He did suffer hypoxia due to acute respiratory failure.  He admits that he is feeling well and ready to return to work.  He works full-time in the Audiological scientist. Denies headache, dizziness, visual changes, shortness of breath, dyspnea on exertion, chest pain, nausea, vomiting or any edema.      History reviewed. No pertinent past medical history.  Past Surgical History:  Procedure Laterality Date  . INGUINAL HERNIA REPAIR Left 03/17/2013   Procedure: LEFT INGUINAL HERNIA REPAIR;  Surgeon: Marcello Moores A. Cornett, MD;  Location: [facility];  Service: General;  Laterality: Left;  . INSERTION OF MESH Left 03/17/2013   Procedure: INSERTION OF MESH;  Surgeon: Joyice Faster. Cornett, MD;  Location: [facility];  Service: General;  Laterality: Left;    Family History  Problem Relation Age of Onset  . Hypertension Mother     Social History   Socioeconomic History  . Marital status: Married    Spouse name: Not on file  . Number of children: 4  . Years of education: Not on file  . Highest education level: Not on file  Occupational History  . Occupation: Banker  Tobacco Use  . Smoking status: Never Smoker  . Smokeless tobacco: Never Used  Substance and Sexual Activity  . Alcohol use: No  . Drug use: No  . Sexual activity: Yes    Birth  control/protection: None  Other Topics Concern  . Not on file  Social History Narrative   Lives with wife and children.    Social Determinants of Health   Financial Resource Strain:   . Difficulty of Paying Living Expenses:   Food Insecurity:   . Worried About Charity fundraiser in the Last Year:   . Arboriculturist in the Last Year:   Transportation Needs:   . Film/video editor (Medical):   Marland Kitchen Lack of Transportation (Non-Medical):   Physical Activity:   . Days of Exercise per Week:   . Minutes of Exercise per Session:   Stress:   . Feeling of Stress :   Social Connections:   . Frequency of Communication with Friends and Family:   . Frequency of Social Gatherings with Friends and Family:   . Attends Religious Services:   . Active Member of Clubs or Organizations:   . Attends Archivist Meetings:   Marland Kitchen Marital Status:   Intimate Partner Violence:   . Fear of Current or Ex-Partner:   . Emotionally Abused:   Marland Kitchen Physically Abused:   . Sexually Abused:     ROS Review of Systems  All other systems reviewed and are negative.   Objective:   Today's Vitals: BP 140/89 (BP Location: Left Arm, Patient Position: Sitting, Cuff Size: Large)   Pulse  71   Temp 98 F (36.7 C)   Ht 6\' 1"  (1.854 m)   Wt 213 lb (96.6 kg)   SpO2 98%   BMI 28.10 kg/m   Physical Exam Constitutional:      General: He is not in acute distress.    Appearance: Normal appearance. He is not ill-appearing or toxic-appearing.  HENT:     Head: Normocephalic and atraumatic.  Cardiovascular:     Rate and Rhythm: Normal rate and regular rhythm.     Pulses: Normal pulses.     Heart sounds: Normal heart sounds.  Pulmonary:     Effort: Pulmonary effort is normal.     Breath sounds: Normal breath sounds.  Abdominal:     General: Bowel sounds are normal.     Palpations: Abdomen is soft.  Musculoskeletal:        General: Normal range of motion.     Cervical back: Normal range of motion.    Skin:    General: Skin is warm.  Neurological:     General: No focal deficit present.     Mental Status: He is alert and oriented to person, place, and time.  Psychiatric:        Mood and Affect: Mood normal.        Behavior: Behavior normal.        Thought Content: Thought content normal.        Judgment: Judgment normal.     Assessment & Plan:   Problem List Items Addressed This Visit      Respiratory   Acute hypoxemic respiratory failure due to COVID-19 [facility])    Other Visit Diagnoses    Healthcare maintenance    -  Primary   Relevant Orders   Urinalysis Dipstick (Completed)   Glucose (CBG) (Completed)   HgB A1c (Completed)   Comp. Metabolic Panel (12)   Lipid panel   CBC with Differential/Platelet   TSH   Vitamin B12   Magnesium Discussed diet and exercise and the fact that it can happen blood pressure.  Patient to monitor blood pressure up to 3 times weekly Weight management goal Follow-up in 3 months for reevaluation of blood pressure    History of 2019 novel coronavirus disease (COVID-19)          Outpatient Encounter Medications as of 02/25/2020  Medication Sig  . albuterol (VENTOLIN HFA) 108 (90 Base) MCG/ACT inhaler Inhale 2 puffs into the lungs every 2 (two) hours as needed for wheezing or shortness of breath.  . [DISCONTINUED] benzonatate (TESSALON PERLES) 100 MG capsule Take 1 capsule (100 mg total) by mouth every 6 (six) hours as needed for cough. (Patient not taking: Reported on 02/25/2020)  . [DISCONTINUED] dexamethasone (DECADRON) 6 MG tablet Take 1 tablet (6 mg total) by mouth daily.   No facility-administered encounter medications on file as of 02/25/2020.    Follow-up: Return in about 3 months (around 05/27/2020).   05/29/2020, NP

## 2020-02-25 NOTE — Patient Instructions (Signed)
   Managing Your Hypertension Hypertension is commonly called high blood pressure. This is when the force of your blood pressing against the walls of your arteries is too strong. Arteries are blood vessels that carry blood from your heart throughout your body. Hypertension forces the heart to work harder to pump blood, and may cause the arteries to become narrow or stiff. Having untreated or uncontrolled hypertension can cause heart attack, stroke, kidney disease, and other problems. What are blood pressure readings? A blood pressure reading consists of a higher number over a lower number. Ideally, your blood pressure should be below 120/80. The first ("top") number is called the systolic pressure. It is a measure of the pressure in your arteries as your heart beats. The second ("bottom") number is called the diastolic pressure. It is a measure of the pressure in your arteries as the heart relaxes. What does my blood pressure reading mean? Blood pressure is classified into four stages. Based on your blood pressure reading, your health care provider may use the following stages to determine what type of treatment you need, if any. Systolic pressure and diastolic pressure are measured in a unit called mm Hg. Normal  Systolic pressure: below 120.  Diastolic pressure: below 80. Elevated  Systolic pressure: 120-129.  Diastolic pressure: below 80. Hypertension stage 1  Systolic pressure: 130-139.  Diastolic pressure: 80-89. Hypertension stage 2  Systolic pressure: 140 or above.  Diastolic pressure: 90 or above. What health risks are associated with hypertension? Managing your hypertension is an important responsibility. Uncontrolled hypertension can lead to:  A heart attack.  A stroke.  A weakened blood vessel (aneurysm).  Heart failure.  Kidney damage.  Eye damage.  Metabolic syndrome.  Memory and concentration problems. What changes can I make to manage my  hypertension? Hypertension can be managed by making lifestyle changes and possibly by taking medicines. Your health care provider will help you make a plan to bring your blood pressure within a normal range. Eating and drinking   Eat a diet that is high in fiber and potassium, and low in salt (sodium), added sugar, and fat. An example eating plan is called the DASH (Dietary Approaches to Stop Hypertension) diet. To eat this way: ? Eat plenty of fresh fruits and vegetables. Try to fill half of your plate at each meal with fruits and vegetables. ? Eat whole grains, such as whole wheat pasta, brown rice, or whole grain bread. Fill about one quarter of your plate with whole grains. ? Eat low-fat diary products. ? Avoid fatty cuts of meat, processed or cured meats, and poultry with skin. Fill about one quarter of your plate with lean proteins such as fish, chicken without skin, beans, eggs, and tofu. ? Avoid premade and processed foods. These tend to be higher in sodium, added sugar, and fat.  Reduce your daily sodium intake. Most people with hypertension should eat less than 1,500 mg of sodium a day.  Limit alcohol intake to no more than 1 drink a day for nonpregnant women and 2 drinks a day for men. One drink equals 12 oz of beer, 5 oz of wine, or 1 oz of hard liquor. Lifestyle  Work with your health care provider to maintain a healthy body weight, or to lose weight. Ask what an ideal weight is for you.  Get at least 30 minutes of exercise that causes your heart to beat faster (aerobic exercise) most days of the week. Activities may include walking, swimming, or biking.    Include exercise to strengthen your muscles (resistance exercise), such as weight lifting, as part of your weekly exercise routine. Try to do these types of exercises for 30 minutes at least 3 days a week.  Do not use any products that contain nicotine or tobacco, such as cigarettes and e-cigarettes. If you need help quitting,  ask your health care provider.  Control any long-term (chronic) conditions you have, such as high cholesterol or diabetes. Monitoring  Monitor your blood pressure at home as told by your health care provider. Your personal target blood pressure may vary depending on your medical conditions, your age, and other factors.  Have your blood pressure checked regularly, as often as told by your health care provider. Working with your health care provider  Review all the medicines you take with your health care provider because there may be side effects or interactions.  Talk with your health care provider about your diet, exercise habits, and other lifestyle factors that may be contributing to hypertension.  Visit your health care provider regularly. Your health care provider can help you create and adjust your plan for managing hypertension. Will I need medicine to control my blood pressure? Your health care provider may prescribe medicine if lifestyle changes are not enough to get your blood pressure under control, and if:  Your systolic blood pressure is 130 or higher.  Your diastolic blood pressure is 80 or higher. Take medicines only as told by your health care provider. Follow the directions carefully. Blood pressure medicines must be taken as prescribed. The medicine does not work as well when you skip doses. Skipping doses also puts you at risk for problems. Contact a health care provider if:  You think you are having a reaction to medicines you have taken.  You have repeated (recurrent) headaches.  You feel dizzy.  You have swelling in your ankles.  You have trouble with your vision. Get help right away if:  You develop a severe headache or confusion.  You have unusual weakness or numbness, or you feel faint.  You have severe pain in your chest or abdomen.  You vomit repeatedly.  You have trouble breathing. Summary  Hypertension is when the force of blood pumping  through your arteries is too strong. If this condition is not controlled, it may put you at risk for serious complications.  Your personal target blood pressure may vary depending on your medical conditions, your age, and other factors. For most people, a normal blood pressure is less than 120/80.  Hypertension is managed by lifestyle changes, medicines, or both. Lifestyle changes include weight loss, eating a healthy, low-sodium diet, exercising more, and limiting alcohol. This information is not intended to replace advice given to you by your health care provider. Make sure you discuss any questions you have with your health care provider. Document Revised: 12/12/2018 Document Reviewed: 07/18/2016 Elsevier Patient Education  2020 [employer]  

## 2020-02-26 LAB — COMP. METABOLIC PANEL (12)
AST: 28 IU/L (ref 0–40)
Albumin/Globulin Ratio: 2 (ref 1.2–2.2)
Albumin: 4.7 g/dL (ref 4.0–5.0)
Alkaline Phosphatase: 60 IU/L (ref 48–121)
BUN/Creatinine Ratio: 14 (ref 9–20)
BUN: 15 mg/dL (ref 6–20)
Bilirubin Total: 0.4 mg/dL (ref 0.0–1.2)
Calcium: 9.7 mg/dL (ref 8.7–10.2)
Chloride: 104 mmol/L (ref 96–106)
Creatinine, Ser: 1.05 mg/dL (ref 0.76–1.27)
GFR calc Af Amer: 104 mL/min/{1.73_m2} (ref >59)
GFR calc non Af Amer: 90 mL/min/{1.73_m2} (ref >59)
Globulin, Total: 2.3 g/dL (ref 1.5–4.5)
Glucose: 113 mg/dL — ABNORMAL HIGH (ref 65–99)
Potassium: 4.3 mmol/L (ref 3.5–5.2)
Sodium: 138 mmol/L (ref 134–144)
Total Protein: 7 g/dL (ref 6.0–8.5)

## 2020-02-26 LAB — LIPID PANEL
Chol/HDL Ratio: 4.5 ratio (ref 0.0–5.0)
Cholesterol, Total: 202 mg/dL — ABNORMAL HIGH (ref 100–199)
HDL: 45 mg/dL (ref >39)
LDL Chol Calc (NIH): 137 mg/dL — ABNORMAL HIGH (ref 0–99)
Triglycerides: 110 mg/dL (ref 0–149)
VLDL Cholesterol Cal: 20 mg/dL (ref 5–40)

## 2020-02-26 LAB — CBC WITH DIFFERENTIAL/PLATELET
Basophils Absolute: 0 10*3/uL (ref 0.0–0.2)
Basos: 1 %
EOS (ABSOLUTE): 0 10*3/uL (ref 0.0–0.4)
Eos: 1 %
Hematocrit: 45.5 % (ref 37.5–51.0)
Hemoglobin: 15.5 g/dL (ref 13.0–17.7)
Immature Grans (Abs): 0 10*3/uL (ref 0.0–0.1)
Immature Granulocytes: 0 %
Lymphocytes Absolute: 1.2 10*3/uL (ref 0.7–3.1)
Lymphs: 43 %
MCH: 29.8 pg (ref 26.6–33.0)
MCHC: 34.1 g/dL (ref 31.5–35.7)
MCV: 88 fL (ref 79–97)
Monocytes Absolute: 0.4 10*3/uL (ref 0.1–0.9)
Monocytes: 13 %
Neutrophils Absolute: 1.2 10*3/uL — ABNORMAL LOW (ref 1.4–7.0)
Neutrophils: 42 %
Platelets: 255 10*3/uL (ref 150–450)
RBC: 5.2 x10E6/uL (ref 4.14–5.80)
RDW: 13.2 % (ref 11.6–15.4)
WBC: 2.8 10*3/uL — ABNORMAL LOW (ref 3.4–10.8)

## 2020-02-26 LAB — TSH: TSH: 0.983 u[IU]/mL (ref 0.450–4.500)

## 2020-02-26 LAB — MAGNESIUM: Magnesium: 2.2 mg/dL (ref 1.6–2.3)

## 2020-02-26 LAB — VITAMIN B12: Vitamin B-12: 440 pg/mL (ref 232–1245)

## 2020-03-08 NOTE — Progress Notes (Signed)
Forwarding to [employer]

## 2020-05-25 ENCOUNTER — Ambulatory Visit: Payer: Self-pay | Admitting: Nurse Practitioner

## 2020-05-27 ENCOUNTER — Ambulatory Visit: Payer: Self-pay | Admitting: Nurse Practitioner

## 2020-06-02 ENCOUNTER — Encounter: Payer: Self-pay | Admitting: Nurse Practitioner

## 2020-06-02 ENCOUNTER — Ambulatory Visit (INDEPENDENT_AMBULATORY_CARE_PROVIDER_SITE_OTHER): Payer: HRSA Program | Admitting: Nurse Practitioner

## 2020-06-02 ENCOUNTER — Other Ambulatory Visit: Payer: Self-pay

## 2020-06-02 VITALS — BP 135/85 | HR 72 | Temp 98.2°F | Ht 73.0 in | Wt 205.4 lb

## 2020-06-02 DIAGNOSIS — R7303 Prediabetes: Secondary | ICD-10-CM

## 2020-06-02 NOTE — Patient Instructions (Addendum)
Healthy Eating Following a healthy eating pattern may help you to achieve and maintain a healthy body weight, reduce the risk of chronic disease, and live a long and productive life. It is important to follow a healthy eating pattern at an appropriate calorie level for your body. Your nutritional needs should be met primarily through food by choosing a variety of nutrient-rich foods. What are tips for following this plan? Reading food labels  Read labels and choose the following: ? Reduced or low sodium. ? Juices with 100% fruit juice. ? Foods with low saturated fats and high polyunsaturated and monounsaturated fats. ? Foods with whole grains, such as whole wheat, cracked wheat, brown rice, and wild rice. ? Whole grains that are fortified with folic acid. This is recommended for women who are pregnant or who want to become pregnant.  Read labels and avoid the following: ? Foods with a lot of added sugars. These include foods that contain brown sugar, corn sweetener, corn syrup, dextrose, fructose, glucose, high-fructose corn syrup, honey, invert sugar, lactose, malt syrup, maltose, molasses, raw sugar, sucrose, trehalose, or turbinado sugar.  Do not eat more than the following amounts of added sugar per day:  6 teaspoons (25 g) for women.  9 teaspoons (38 g) for men. ? Foods that contain processed or refined starches and grains. ? Refined grain products, such as white flour, degermed cornmeal, white bread, and white rice. Shopping  Choose nutrient-rich snacks, such as vegetables, whole fruits, and nuts. Avoid high-calorie and high-sugar snacks, such as potato chips, fruit snacks, and candy.  Use oil-based dressings and spreads on foods instead of solid fats such as butter, stick margarine, or cream cheese.  Limit pre-made sauces, mixes, and "instant" products such as flavored rice, instant noodles, and ready-made pasta.  Try more plant-protein sources, such as tofu, tempeh, black beans,  edamame, lentils, nuts, and seeds.  Explore eating plans such as the Mediterranean diet or vegetarian diet. Cooking  Use oil to saut or stir-fry foods instead of solid fats such as butter, stick margarine, or lard.  Try baking, boiling, grilling, or broiling instead of frying.  Remove the fatty part of meats before cooking.  Steam vegetables in water or broth. Meal planning   At meals, imagine dividing your plate into fourths: ? One-half of your plate is fruits and vegetables. ? One-fourth of your plate is whole grains. ? One-fourth of your plate is protein, especially lean meats, poultry, eggs, tofu, beans, or nuts.  Include low-fat dairy as part of your daily diet. Lifestyle  Choose healthy options in all settings, including home, work, school, restaurants, or stores.  Prepare your food safely: ? Wash your hands after handling raw meats. ? Keep food preparation surfaces clean by regularly washing with hot, soapy water. ? Keep raw meats separate from ready-to-eat foods, such as fruits and vegetables. ? Cook seafood, meat, poultry, and eggs to the recommended internal temperature. ? Store foods at safe temperatures. In general:  Keep cold foods at 59F (4.4C) or below.  Keep hot foods at 159F (60C) or above.  Keep your freezer at [facility] (-17.8C) or below.  Foods are no longer safe to eat when they have been between the temperatures of 40-159F (4.4-60C) for more than 2 hours. What foods should I eat? Fruits Aim to eat 2 cup-equivalents of fresh, canned (in natural juice), or frozen fruits each day. Examples of 1 cup-equivalent of fruit include 1 small apple, 8 large strawberries, 1 cup canned fruit,  cup  dried fruit, or 1 cup 100% juice. Vegetables Aim to eat 2-3 cup-equivalents of fresh and frozen vegetables each day, including different varieties and colors. Examples of 1 cup-equivalent of vegetables include 2 medium carrots, 2 cups raw, leafy greens, 1 cup chopped  vegetable (raw or cooked), or 1 medium baked potato. Grains Aim to eat 6 ounce-equivalents of whole grains each day. Examples of 1 ounce-equivalent of grains include 1 slice of bread, 1 cup ready-to-eat cereal, 3 cups popcorn, or  cup cooked rice, pasta, or cereal. Meats and other proteins Aim to eat 5-6 ounce-equivalents of protein each day. Examples of 1 ounce-equivalent of protein include 1 egg, 1/2 cup nuts or seeds, or 1 tablespoon (16 g) peanut butter. A cut of meat or fish that is the size of a deck of cards is about 3-4 ounce-equivalents.  Of the protein you eat each week, try to have at least 8 ounces come from seafood. This includes salmon, trout, herring, and anchovies. Dairy Aim to eat 3 cup-equivalents of fat-free or low-fat dairy each day. Examples of 1 cup-equivalent of dairy include 1 cup (240 mL) milk, 8 ounces (250 g) yogurt, 1 ounces (44 g) natural cheese, or 1 cup (240 mL) fortified soy milk. Fats and oils  Aim for about 5 teaspoons (21 g) per day. Choose monounsaturated fats, such as canola and olive oils, avocados, peanut butter, and most nuts, or polyunsaturated fats, such as sunflower, corn, and soybean oils, walnuts, pine nuts, sesame seeds, sunflower seeds, and flaxseed. Beverages  Aim for six 8-oz glasses of water per day. Limit coffee to three to five 8-oz cups per day.  Limit caffeinated beverages that have added calories, such as soda and energy drinks.  Limit alcohol intake to no more than 1 drink a day for nonpregnant women and 2 drinks a day for men. One drink equals 12 oz of beer (355 mL), 5 oz of wine (148 mL), or 1 oz of hard liquor (44 mL). Seasoning and other foods  Avoid adding excess amounts of salt to your foods. Try flavoring foods with herbs and spices instead of salt.  Avoid adding sugar to foods.  Try using oil-based dressings, sauces, and spreads instead of solid fats. This information is based on general [place] nutrition guidelines. For more  information, visit [employer]. Exact amounts may vary based on your nutrition needs. Summary  A healthy eating plan may help you to maintain a healthy weight, reduce the risk of chronic diseases, and stay active throughout your life.  Plan your meals. Make sure you eat the right portions of a variety of nutrient-rich foods.  Try baking, boiling, grilling, or broiling instead of frying.  Choose healthy options in all settings, including home, work, school, restaurants, or stores. This information is not intended to replace advice given to you by your health care provider. Make sure you discuss any questions you have with your health care provider. Document Revised: 12/02/2017 Document Reviewed: 12/02/2017 Elsevier Patient Education  [place].

## 2020-06-02 NOTE — Progress Notes (Signed)
[facility] [address] [place], [place]  [address] Phone:  [phone]   Fax:  [phone]   Established Patient Office Visit  Subjective:  Patient ID: Ricky Keith, male    DOB: October 21, 1980  Age: 39 y.o. MRN: [id number]  CC:  Chief Complaint  Patient presents with  . Follow-up    HPI Ricky Keith presents for follow up. He  has no past medical history on file.  He has a history of elevated blood A1c.  His last A1c was 6.6%.  We agreed to make lifestyle modifications with strict diet and exercise and return in 3 months for reevaluation.  His A1c today 6.2%.  He has a has 8 pound weight loss.  He has made lifestyle modifications with his diet.  He has limited his carb intake.  He is exercising daily.  History reviewed. No pertinent past medical history.  Past Surgical History:  Procedure Laterality Date  . INGUINAL HERNIA REPAIR Left 03/17/2013   Procedure: LEFT INGUINAL HERNIA REPAIR;  Surgeon: Maisie Fus A. Cornett, MD;  Location: [facility];  Service: General;  Laterality: Left;  . INSERTION OF MESH Left 03/17/2013   Procedure: INSERTION OF MESH;  Surgeon: Clovis Pu. Cornett, MD;  Location: [facility];  Service: General;  Laterality: Left;    Family History  Problem Relation Age of Onset  . Hypertension Mother     Social History   Socioeconomic History  . Marital status: Married    Spouse name: Not on file  . Number of children: 4  . Years of education: Not on file  . Highest education level: Not on file  Occupational History  . Occupation: Banker  Tobacco Use  . Smoking status: Never Smoker  . Smokeless tobacco: Never Used  Substance and Sexual Activity  . Alcohol use: No  . Drug use: No  . Sexual activity: Yes    Birth control/protection: None  Other Topics Concern  . Not on file  Social History Narrative   Lives with wife and children.    Social Determinants of Health   Financial Resource Strain:   .  Difficulty of Paying Living Expenses: Not on file  Food Insecurity:   . Worried About Programme researcher, broadcasting/film/video in the Last Year: Not on file  . Ran Out of Food in the Last Year: Not on file  Transportation Needs:   . Lack of Transportation (Medical): Not on file  . Lack of Transportation (Non-Medical): Not on file  Physical Activity:   . Days of Exercise per Week: Not on file  . Minutes of Exercise per Session: Not on file  Stress:   . Feeling of Stress : Not on file  Social Connections:   . Frequency of Communication with Friends and Family: Not on file  . Frequency of Social Gatherings with Friends and Family: Not on file  . Attends Religious Services: Not on file  . Active Member of Clubs or Organizations: Not on file  . Attends Banker Meetings: Not on file  . Marital Status: Not on file  Intimate Partner Violence:   . Fear of Current or Ex-Partner: Not on file  . Emotionally Abused: Not on file  . Physically Abused: Not on file  . Sexually Abused: Not on file    Outpatient Medications Prior to Visit  Medication Sig Dispense Refill  . albuterol (VENTOLIN HFA) 108 (90 Base) MCG/ACT inhaler Inhale 2 puffs into the lungs every  2 (two) hours as needed for wheezing or shortness of breath. 6.7 g 0   No facility-administered medications prior to visit.    No Known Allergies  ROS Review of Systems  Constitutional: Negative.        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   Eyes: Negative.   Respiratory: Negative.   Cardiovascular: Negative.   Gastrointestinal: Negative.   Endocrine: Negative.   Genitourinary: Negative.   Musculoskeletal: Negative.   Skin: Negative.   Allergic/Immunologic: Negative.   Neurological: Negative.   Hematological: Negative.   Psychiatric/Behavioral: Negative.       Objective:    Physical Exam Constitutional:      General: He is not in acute distress.    Appearance: He is normal weight. He is not  ill-appearing, toxic-appearing or diaphoretic.  HENT:     Head: Normocephalic and atraumatic.     Nose: Nose normal.     Mouth/Throat:     Mouth: Mucous membranes are moist.  Cardiovascular:     Rate and Rhythm: Normal rate and regular rhythm.     Pulses: Normal pulses.     Heart sounds: Normal heart sounds.  Pulmonary:     Effort: Pulmonary effort is normal.     Breath sounds: Normal breath sounds.  Abdominal:     General: Bowel sounds are normal.     Palpations: Abdomen is soft.  Musculoskeletal:        General: Normal range of motion.     Cervical back: Normal range of motion.  Skin:    General: Skin is warm.     Capillary Refill: Capillary refill takes less than 2 seconds.  Neurological:     General: No focal deficit present.     Mental Status: He is alert and oriented to person, place, and time.  Psychiatric:        Mood and Affect: Mood normal.        Behavior: Behavior normal.        Thought Content: Thought content normal.        Judgment: Judgment normal.     BP 135/85   Pulse 72   Temp 98.2 F (36.8 C) (Temporal)   Ht 6\' 1"  (1.854 m)   Wt 205 lb 6.4 oz (93.2 kg)   SpO2 98%   BMI 27.10 kg/m  Wt Readings from Last 3 Encounters:  06/02/20 205 lb 6.4 oz (93.2 kg)  02/25/20 213 lb (96.6 kg)  01/20/20 209 lb 6.4 oz (95 kg)     Health Maintenance Due  Topic Date Due  . COVID-19 Vaccine (1) Never done    There are no preventive care reminders to display for this patient.  Lab Results  Component Value Date   TSH 0.983 02/25/2020   Lab Results  Component Value Date   WBC 2.8 (L) 02/25/2020   HGB 15.5 02/25/2020   HCT 45.5 02/25/2020   MCV 88 02/25/2020   PLT 255 02/25/2020   Lab Results  Component Value Date   NA 138 02/25/2020   K 4.3 02/25/2020   CO2 28 01/22/2020   GLUCOSE 113 (H) 02/25/2020   BUN 15 02/25/2020   CREATININE 1.05 02/25/2020   BILITOT 0.4 02/25/2020   ALKPHOS 60 02/25/2020   AST 28 02/25/2020   ALT 47 (H) 01/22/2020    PROT 7.0 02/25/2020   ALBUMIN 4.7 02/25/2020   CALCIUM 9.7 02/25/2020   ANIONGAP 9 01/22/2020   Lab Results  Component  Value Date   CHOL 202 (H) 02/25/2020   Lab Results  Component Value Date   HDL 45 02/25/2020   Lab Results  Component Value Date   LDLCALC 137 (H) 02/25/2020   Lab Results  Component Value Date   TRIG 110 02/25/2020   Lab Results  Component Value Date   CHOLHDL 4.5 02/25/2020   Lab Results  Component Value Date   HGBA1C 6.6 (A) 02/25/2020   HGBA1C 6.6 02/25/2020   HGBA1C 6.6 (A) 02/25/2020   HGBA1C 6.6 02/25/2020      Assessment & Plan:   Problem List Items Addressed This Visit    None    Visit Diagnoses    Prediabetes    -  Primary Consider home glucose monitoring Weight loss at least 5% of current body weight is can be achieved with lifestyle modification dietary changes and regular daily exercise Encourage blood pressure control goal <120/80 and maintaining total cholesterol <200 Follow-up every 3 to 6 months for reevaluation       No orders of the defined types were placed in this encounter.   Follow-up: Return in about 3 months (around 09/01/2020).    Barbette Merino, NP

## 2020-09-01 ENCOUNTER — Ambulatory Visit: Payer: Self-pay | Admitting: Nurse Practitioner

## 2021-01-10 IMAGING — DX DG CHEST 1V PORT
1 series · 1 of 1 positions shown · non-contrast
Comparison: None.

CLINICAL DATA: COVID pneumonia. Shortness of breath

EXAM:
PORTABLE CHEST 1 VIEW

[chest ap]
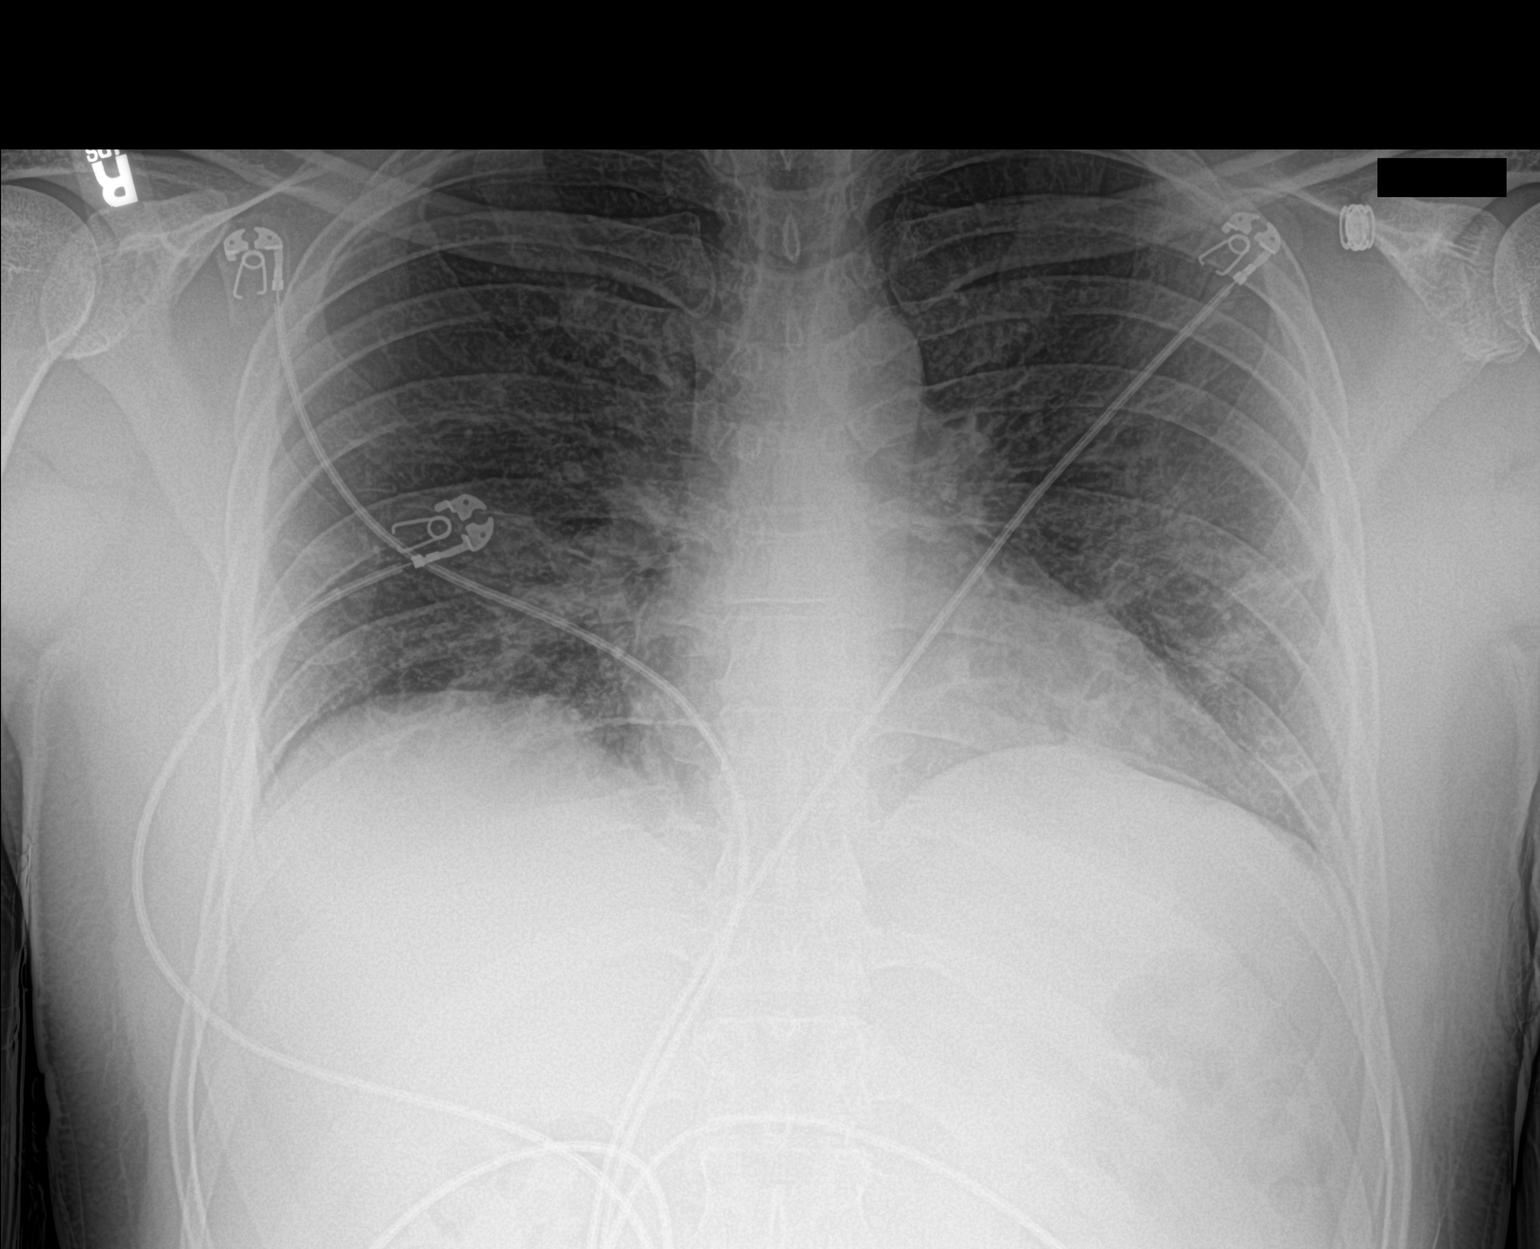

[1 of 1 positions shown; findings below may reference images not displayed]

FINDINGS: The cardiac silhouette, mediastinal and hilar contours are within
normal limits.

Low lung volumes. Patchy, hazy bilateral infiltrates consistent with
COVID pneumonia. No pleural effusions. No pneumothorax. The bony
thorax is intact.
IMPRESSION: Bilateral infiltrates consistent with COVID pneumonia.
